# Patient Record
Sex: Female | Born: 1990
Health system: Southern US, Community
[De-identification: ages and names within clinical notes are randomized; demographics above are authoritative.]

## PROBLEM LIST (undated history)

## (undated) DIAGNOSIS — O09299 Supervision of pregnancy with other poor reproductive or obstetric history, unspecified trimester: Secondary | ICD-10-CM

## (undated) DIAGNOSIS — O139 Gestational [pregnancy-induced] hypertension without significant proteinuria, unspecified trimester: Secondary | ICD-10-CM

## (undated) DIAGNOSIS — O09899 Supervision of other high risk pregnancies, unspecified trimester: Secondary | ICD-10-CM

## (undated) HISTORY — DX: Gestational (pregnancy-induced) hypertension without significant proteinuria, unspecified trimester: O13.9

## (undated) HISTORY — PX: TONSILLECTOMY: SUR1361

---

## 2011-05-21 DIAGNOSIS — E669 Obesity, unspecified: Secondary | ICD-10-CM | POA: Insufficient documentation

## 2013-10-16 DIAGNOSIS — B9689 Other specified bacterial agents as the cause of diseases classified elsewhere: Secondary | ICD-10-CM | POA: Insufficient documentation

## 2013-10-16 DIAGNOSIS — N760 Acute vaginitis: Secondary | ICD-10-CM | POA: Insufficient documentation

## 2014-02-12 DIAGNOSIS — A6000 Herpesviral infection of urogenital system, unspecified: Secondary | ICD-10-CM | POA: Insufficient documentation

## 2014-09-13 DIAGNOSIS — F331 Major depressive disorder, recurrent, moderate: Secondary | ICD-10-CM | POA: Insufficient documentation

## 2015-09-09 DIAGNOSIS — O09299 Supervision of pregnancy with other poor reproductive or obstetric history, unspecified trimester: Secondary | ICD-10-CM | POA: Insufficient documentation

## 2015-10-03 DIAGNOSIS — B977 Papillomavirus as the cause of diseases classified elsewhere: Secondary | ICD-10-CM | POA: Insufficient documentation

## 2018-02-26 ENCOUNTER — Other Ambulatory Visit: Payer: Self-pay

## 2018-02-26 ENCOUNTER — Emergency Department (HOSPITAL_BASED_OUTPATIENT_CLINIC_OR_DEPARTMENT_OTHER)
Admission: EM | Admit: 2018-02-26 | Discharge: 2018-02-26 | Disposition: A | Discharge status: Home / Self Care | Payer: Medicaid Other | Attending: Emergency Medicine | Admitting: Emergency Medicine

## 2018-02-26 ENCOUNTER — Encounter (HOSPITAL_BASED_OUTPATIENT_CLINIC_OR_DEPARTMENT_OTHER): Payer: Self-pay | Admitting: Emergency Medicine

## 2018-02-26 DIAGNOSIS — K0889 Other specified disorders of teeth and supporting structures: Secondary | ICD-10-CM | POA: Diagnosis not present

## 2018-02-26 DIAGNOSIS — F17200 Nicotine dependence, unspecified, uncomplicated: Secondary | ICD-10-CM | POA: Diagnosis not present

## 2018-02-26 MED ORDER — LIDOCAINE VISCOUS HCL 2 % MT SOLN: 15.0000 mL | OROMUCOSAL | 0 refills | Status: DC | PRN

## 2018-02-26 MED ORDER — AMOXICILLIN-POT CLAVULANATE 875-125 MG PO TABS: 1.0000 | ORAL_TABLET | Freq: Two times a day (BID) | ORAL | 0 refills | Status: DC

## 2018-02-26 NOTE — ED Triage Notes (Signed)
Dental pain and facial swelling since yesterday.

## 2018-02-26 NOTE — ED Provider Notes (Signed)
[facility] EMERGENCY DEPARTMENT Provider Note   CSN: [id number] Arrival date & time: 02/26/18  1001     History   Chief Complaint Chief Complaint  Patient presents with  . Dental Pain    HPI Kara Sanchez is a 27 y.o. female who presents to ED for several month history of intermittent bilateral dental pain.  States that about 3 to 4 months ago, she had her right upper teeth break off spontaneously.  The same thing happened about 1 month later on her left side.  She states that since yesterday she has had worsening sharp pain and swelling associated with her left upper teeth.  She has not seen a dentist in over a year.  She has not taken any medications to help with her symptoms.  She denies any trismus, drooling, fever, neck pain, changes in voice.  HPI  History reviewed. No pertinent past medical history.  There are no active problems to display for this patient.   Past Surgical History:  Procedure Laterality Date  . TONSILLECTOMY       OB History   No obstetric history on file.      Home Medications    Prior to Admission medications   Medication Sig Start Date End Date Taking? Authorizing Provider  amoxicillin-clavulanate (AUGMENTIN) 875-125 MG tablet Take 1 tablet by mouth every 12 (twelve) hours. 02/26/18   Aydenn Gervin, PA-C  lidocaine (XYLOCAINE) 2 % solution Use as directed 15 mLs in the mouth or throat as needed for mouth pain. 02/26/18   Dietrich PatesKhatri, Braylyn Eye, PA-C    Family History No family history on file.  Social History Social History   Tobacco Use  . Smoking status: Current Every Day Smoker  . Smokeless tobacco: Never Used  Substance Use Topics  . Alcohol use: Not on file  . Drug use: Not on file     Allergies   Patient has no known allergies.   Review of Systems Review of Systems  Constitutional: Negative for chills and fever.  HENT: Positive for dental problem and facial swelling. Negative for rhinorrhea and sinus pressure.     Respiratory: Negative for shortness of breath.   Musculoskeletal: Negative for neck pain.     Physical Exam Updated Vital Signs BP 132/69 (BP Location: Left Arm)   Pulse 92   Temp 97.8 F (36.6 C) (Oral)   Resp 18   Ht 5\' 8"  (1.727 m)   Wt 99.3 kg   LMP 02/02/2018   SpO2 99%   BMI 33.30 kg/m   Physical Exam Vitals signs and nursing note reviewed.  Constitutional:      General: She is not in acute distress.    Appearance: She is well-developed. She is not diaphoretic.  HENT:     Head: Normocephalic and atraumatic.     Mouth/Throat:      Comments: Overall poor dentition with several missing, chipped teeth noted.  Mild induration noted of the left cheek with no fluctuance. No facial, neck swelling noted. No pooling of secretions or trismus.  Normal voice noted with no difficulty swallowing or breathing. No submandibular erythema, edema or crepitus noted. Eyes:     General: No scleral icterus.    Conjunctiva/sclera: Conjunctivae normal.  Neck:     Musculoskeletal: Normal range of motion.  Pulmonary:     Effort: Pulmonary effort is normal. No respiratory distress.  Skin:    Findings: No rash.  Neurological:     Mental Status: She is alert.  ED Treatments / Results  Labs (all labs ordered are listed, but only abnormal results are displayed) Labs Reviewed - No data to display  EKG None  Radiology No results found.  Procedures Procedures (including critical care time)  Medications Ordered in ED Medications - No data to display   Initial Impression / Assessment and Plan / ED Course  I have reviewed the triage vital signs and the nursing notes.  Pertinent labs & imaging results that were available during my care of the patient were reviewed by me and considered in my medical decision making (see chart for details).     Patient with dentalgia. On exam, there is no evidence of a drainable abscess. No trismus, glossal elevation, unilateral tonsillar  swelling. No evidence of retropharyngeal or peritonsillar abscess or Ludwig angina. Will treat with  antibiotic and viscous lidocaine. Pt instructed to follow-up with dentist as soon as possible. Resource guide provided with AVS.  Patient is hemodynamically stable, in NAD, and able to ambulate in the ED. Evaluation does not show pathology that would require ongoing emergent intervention or inpatient treatment. I explained the diagnosis to the patient. Pain has been managed and has no complaints prior to discharge. Patient is comfortable with above plan and is stable for discharge at this time. All questions were answered prior to disposition. Strict return precautions for returning to the ED were discussed. Encouraged follow up with PCP.    Portions of this note were generated with Scientist, clinical (histocompatibility and immunogenetics)Dragon dictation software. Dictation errors may occur despite best attempts at proofreading.   Final Clinical Impressions(s) / ED Diagnoses   Final diagnoses:  Pain, dental    ED Discharge Orders         Ordered    amoxicillin-clavulanate (AUGMENTIN) 875-125 MG tablet  Every 12 hours     02/26/18 1032    lidocaine (XYLOCAINE) 2 % solution  As needed     02/26/18 [address]1032           Klyn Kroening, PA-C 02/26/18 1035    Shaune PollackIsaacs, Cameron, MD 02/26/18 1945

## 2018-02-26 NOTE — ED Notes (Signed)
Right side of face is swollen.  Broken tooth to left upper side of the mouth is noted.  Verbalized pain to left upper gums radiating to her left face.

## 2018-02-26 NOTE — Discharge Instructions (Signed)
Take your antibiotics as prescribed. Take the pain medication as needed. Return to ED for worsening symptoms, trouble breathing or trouble swallowing, increased swelling, trouble opening your mouth.

## 2018-03-09 NOTE — L&D Delivery Note (Addendum)
Delivery Note Progressed quickly to complete dilation with vertex at the perineum  At 12:11 AM a viable and healthy female was delivered via Vaginal, Spontaneous (Presentation:LOA ).  APGAR: , ; weight  .    No difficulty with shoulders  Placenta status: spontaneous and grossly intact with 3 vessel Cord:  with the following complications: nuchal cord x 1, tight, delivered through  Anesthesia:  epidural Episiotomy: None Lacerations: None Suture Repair: none Est. Blood Loss (mL):  150  Mom to postpartum.  Baby to Couplet care / Skin to Skin.  Hansel Feinstein 10/23/2018, 12:24 AM   HP Please schedule this patient for Postpartum visit in: 4 weeks with the following provider: Any provider For C/S patients schedule nurse incision check in weeks 2 weeks: no Low risk pregnancy complicated by: none Delivery mode:  SVD Anticipated Birth Control:  IUD PP Procedures needed: none  Schedule Integrated BH visit: no

## 2018-04-11 ENCOUNTER — Other Ambulatory Visit: Payer: Self-pay

## 2018-04-11 ENCOUNTER — Telehealth (HOSPITAL_BASED_OUTPATIENT_CLINIC_OR_DEPARTMENT_OTHER): Payer: Self-pay | Admitting: *Deleted

## 2018-04-11 ENCOUNTER — Encounter (HOSPITAL_BASED_OUTPATIENT_CLINIC_OR_DEPARTMENT_OTHER): Payer: Self-pay | Admitting: Emergency Medicine

## 2018-04-11 ENCOUNTER — Emergency Department (HOSPITAL_BASED_OUTPATIENT_CLINIC_OR_DEPARTMENT_OTHER): Payer: Medicaid Other

## 2018-04-11 ENCOUNTER — Emergency Department (HOSPITAL_BASED_OUTPATIENT_CLINIC_OR_DEPARTMENT_OTHER)
Admission: EM | Admit: 2018-04-11 | Discharge: 2018-04-11 | Disposition: A | Discharge status: Home / Self Care | Payer: Medicaid Other | Attending: Emergency Medicine | Admitting: Emergency Medicine

## 2018-04-11 DIAGNOSIS — R110 Nausea: Secondary | ICD-10-CM | POA: Diagnosis not present

## 2018-04-11 DIAGNOSIS — O9989 Other specified diseases and conditions complicating pregnancy, childbirth and the puerperium: Secondary | ICD-10-CM | POA: Diagnosis not present

## 2018-04-11 DIAGNOSIS — F17200 Nicotine dependence, unspecified, uncomplicated: Secondary | ICD-10-CM | POA: Insufficient documentation

## 2018-04-11 DIAGNOSIS — Z3A11 11 weeks gestation of pregnancy: Secondary | ICD-10-CM | POA: Insufficient documentation

## 2018-04-11 DIAGNOSIS — R197 Diarrhea, unspecified: Secondary | ICD-10-CM

## 2018-04-11 DIAGNOSIS — R1010 Upper abdominal pain, unspecified: Secondary | ICD-10-CM | POA: Diagnosis not present

## 2018-04-11 DIAGNOSIS — O99331 Smoking (tobacco) complicating pregnancy, first trimester: Secondary | ICD-10-CM | POA: Insufficient documentation

## 2018-04-11 DIAGNOSIS — R143 Flatulence: Secondary | ICD-10-CM | POA: Diagnosis not present

## 2018-04-11 DIAGNOSIS — Z3490 Encounter for supervision of normal pregnancy, unspecified, unspecified trimester: Secondary | ICD-10-CM

## 2018-04-11 HISTORY — DX: Supervision of pregnancy with other poor reproductive or obstetric history, unspecified trimester: O09.299

## 2018-04-11 HISTORY — DX: Supervision of other high risk pregnancies, unspecified trimester: O09.899

## 2018-04-11 LAB — URINALYSIS, MICROSCOPIC (REFLEX)

## 2018-04-11 LAB — CBC WITH DIFFERENTIAL/PLATELET
Abs Immature Granulocytes: 0.03 10*3/uL (ref 0.00–0.07)
Basophils Absolute: 0 10*3/uL (ref 0.0–0.1)
Basophils Relative: 0 %
Eosinophils Absolute: 0.2 10*3/uL (ref 0.0–0.5)
Eosinophils Relative: 3 %
HCT: 41.8 % (ref 36.0–46.0)
Hemoglobin: 12.8 g/dL (ref 12.0–15.0)
Immature Granulocytes: 0 %
Lymphocytes Relative: 16 %
Lymphs Abs: 1.1 10*3/uL (ref 0.7–4.0)
MCH: 22.5 pg — AB (ref 26.0–34.0)
MCHC: 30.6 g/dL (ref 30.0–36.0)
MCV: 73.5 fL — ABNORMAL LOW (ref 80.0–100.0)
Monocytes Absolute: 0.4 10*3/uL (ref 0.1–1.0)
Monocytes Relative: 6 %
Neutro Abs: 5.1 10*3/uL (ref 1.7–7.7)
Neutrophils Relative %: 75 %
Platelets: 304 10*3/uL (ref 150–400)
RBC: 5.69 MIL/uL — ABNORMAL HIGH (ref 3.87–5.11)
RDW: 14.7 % (ref 11.5–15.5)
WBC: 6.8 10*3/uL (ref 4.0–10.5)
nRBC: 0 % (ref 0.0–0.2)

## 2018-04-11 LAB — COMPREHENSIVE METABOLIC PANEL
ALT: 19 U/L (ref 0–44)
AST: 18 U/L (ref 15–41)
Albumin: 4.3 g/dL (ref 3.5–5.0)
Alkaline Phosphatase: 62 U/L (ref 38–126)
Anion gap: 8 (ref 5–15)
BILIRUBIN TOTAL: 0.5 mg/dL (ref 0.3–1.2)
BUN: 7 mg/dL (ref 6–20)
CO2: 22 mmol/L (ref 22–32)
Calcium: 9 mg/dL (ref 8.9–10.3)
Chloride: 106 mmol/L (ref 98–111)
Creatinine, Ser: 0.43 mg/dL — ABNORMAL LOW (ref 0.44–1.00)
GFR calc non Af Amer: >60 mL/min (ref >60)
Glucose, Bld: 86 mg/dL (ref 70–99)
Potassium: 3.6 mmol/L (ref 3.5–5.1)
Sodium: 136 mmol/L (ref 135–145)
TOTAL PROTEIN: 7.7 g/dL (ref 6.5–8.1)

## 2018-04-11 LAB — URINALYSIS, ROUTINE W REFLEX MICROSCOPIC
Bilirubin Urine: NEGATIVE
Glucose, UA: NEGATIVE mg/dL
Hgb urine dipstick: NEGATIVE
Ketones, ur: NEGATIVE mg/dL
NITRITE: NEGATIVE
Protein, ur: NEGATIVE mg/dL
Specific Gravity, Urine: 1.025 (ref 1.005–1.030)
pH: 6 (ref 5.0–8.0)

## 2018-04-11 LAB — C DIFFICILE QUICK SCREEN W PCR REFLEX
C DIFFICLE (CDIFF) ANTIGEN: NEGATIVE
C Diff interpretation: NOT DETECTED
C Diff toxin: NEGATIVE

## 2018-04-11 LAB — GASTROINTESTINAL PANEL BY PCR, STOOL (REPLACES STOOL CULTURE)
ASTROVIRUS: NOT DETECTED
Adenovirus F40/41: NOT DETECTED
Campylobacter species: NOT DETECTED
Cryptosporidium: NOT DETECTED
Cyclospora cayetanensis: NOT DETECTED
Entamoeba histolytica: NOT DETECTED
Enteroaggregative E coli (EAEC): NOT DETECTED
Enteropathogenic E coli (EPEC): NOT DETECTED
Enterotoxigenic E coli (ETEC): NOT DETECTED
Giardia lamblia: NOT DETECTED
Norovirus GI/GII: DETECTED — AB
Plesimonas shigelloides: NOT DETECTED
Rotavirus A: NOT DETECTED
Salmonella species: NOT DETECTED
Sapovirus (I, II, IV, and V): NOT DETECTED
Shiga like toxin producing E coli (STEC): NOT DETECTED
Shigella/Enteroinvasive E coli (EIEC): NOT DETECTED
Vibrio cholerae: NOT DETECTED
Vibrio species: NOT DETECTED
Yersinia enterocolitica: NOT DETECTED

## 2018-04-11 LAB — LIPASE, BLOOD: LIPASE: 24 U/L (ref 11–51)

## 2018-04-11 MED ORDER — SODIUM CHLORIDE 0.9 % IV BOLUS
1000.0000 mL | Freq: Once | INTRAVENOUS | Status: AC
  Administered 2018-04-11: 1000 mL via INTRAVENOUS

## 2018-04-11 MED ORDER — SIMETHICONE 40 MG/0.6ML PO SUSP: 40.0000 mg | Freq: Four times a day (QID) | ORAL | 0 refills | Status: DC | PRN

## 2018-04-11 NOTE — ED Provider Notes (Signed)
[facility] EMERGENCY DEPARTMENT Provider Note   CSN: [id number] Arrival date & time: 04/11/18  [id number]     History   Chief Complaint Chief Complaint  Patient presents with  . Abdominal Pain    HPI Kara Sanchez is a 28 y.o. female.  HPI  [id number] [redacted]wk pregnant 9AM had rumbling abdominal pain, gas, then cramping pain, then began to have diarrhea.   Since 9AM has had about 6-7 episodes of diarrhea, watery diarrhea Constant abdominal cramping, upper part of abdomen on both sides No known sick contacts Nausea but no vomiting December was on abx for abscess No vaginal bleeding or contractions  [facility], in [facility]   Past Medical History:  Diagnosis Date  . Hx of preeclampsia, prior pregnancy, currently pregnant   . Previous pregnancy complicated by pregnancy-induced hypertension, antepartum     There are no active problems to display for this patient.   Past Surgical History:  Procedure Laterality Date  . TONSILLECTOMY       OB History    Gravida  7   Para  4   Term      Preterm      AB  2   Living        SAB  2   TAB      Ectopic      Multiple      Live Births               Home Medications    Prior to Admission medications   Medication Sig Start Date End Date Taking? Authorizing Provider  amoxicillin-clavulanate (AUGMENTIN) 875-125 MG tablet Take 1 tablet by mouth every 12 (twelve) hours. 02/26/18   Khatri, Hina, PA-C  lidocaine (XYLOCAINE) 2 % solution Use as directed 15 mLs in the mouth or throat as needed for mouth pain. 02/26/18   Dietrich PatesKhatri, Hina, PA-C    Family History No family history on file.  Social History Social History   Tobacco Use  . Smoking status: Current Every Day Smoker  . Smokeless tobacco: Never Used  Substance Use Topics  . Alcohol use: Not on file  . Drug use: Not on file     Allergies   Patient has no known allergies.   Review of Systems Review of Systems  Constitutional: Negative for  fever.  HENT: Negative for sore throat.   Eyes: Negative for visual disturbance.  Respiratory: Negative for cough and shortness of breath.   Cardiovascular: Negative for chest pain.  Gastrointestinal: Positive for abdominal pain, diarrhea and nausea. Negative for constipation and vomiting.  Genitourinary: Negative for difficulty urinating and dysuria.  Musculoskeletal: Negative for back pain.  Skin: Negative for rash.  Neurological: Positive for light-headedness. Negative for syncope and headaches.     Physical Exam Updated Vital Signs BP 139/64   Pulse 92   Temp 97.9 F (36.6 C) (Oral)   Resp 20   Ht 5\' 8"  (1.727 m)   Wt 99.3 kg   LMP 12/30/2017 (Exact Date) Comment: Patient claims she was keeping track  SpO2 100%   BMI 33.30 kg/m   Physical Exam Vitals signs and nursing note reviewed.  Constitutional:      General: She is not in acute distress.    Appearance: She is well-developed. She is not diaphoretic.  HENT:     Head: Normocephalic and atraumatic.  Eyes:     Conjunctiva/sclera: Conjunctivae normal.  Neck:     Musculoskeletal: Normal range of motion.  Cardiovascular:  Rate and Rhythm: Normal rate and regular rhythm.     Heart sounds: Normal heart sounds. No murmur. No friction rub. No gallop.   Pulmonary:     Effort: Pulmonary effort is normal. No respiratory distress.     Breath sounds: Normal breath sounds. No wheezing or rales.  Abdominal:     General: There is no distension.     Palpations: Abdomen is soft.     Tenderness: There is no abdominal tenderness. There is no guarding.  Musculoskeletal:        General: No tenderness.  Skin:    General: Skin is warm and dry.     Findings: No erythema or rash.  Neurological:     Mental Status: She is alert and oriented to person, place, and time.      ED Treatments / Results  Labs (all labs ordered are listed, but only abnormal results are displayed) Labs Reviewed  URINALYSIS, ROUTINE W REFLEX  MICROSCOPIC - Abnormal; Notable for the following components:      Result Value   Leukocytes, UA TRACE (*)    All other components within normal limits  CBC WITH DIFFERENTIAL/PLATELET - Abnormal; Notable for the following components:   RBC 5.69 (*)    MCV 73.5 (*)    MCH 22.5 (*)    All other components within normal limits  COMPREHENSIVE METABOLIC PANEL - Abnormal; Notable for the following components:   Creatinine, Ser 0.43 (*)    All other components within normal limits  URINALYSIS, MICROSCOPIC (REFLEX) - Abnormal; Notable for the following components:   Bacteria, UA MANY (*)    All other components within normal limits  URINE CULTURE  C DIFFICILE QUICK SCREEN W PCR REFLEX  GASTROINTESTINAL PANEL BY PCR, STOOL (REPLACES STOOL CULTURE)  LIPASE, BLOOD    EKG None  Radiology US Ob Comp Less 14 Wks  Result Date: 04/11/2018 CLINICAL DATA:  Cramps and diarrhea. EXAM: OBSTETRIC <14 WK ULTRASOUND TECHNIQUE: Transabdominal ultrasound was performed for evaluation of the gestation as well as the maternal uterus and adnexal regions. COMPARISON:  None. FINDINGS: Intrauterine gestational sac: Single Yolk sac:  Not visualized Embryo:  Present Cardiac Activity: Present Heart Rate: 169 bpm CRL:   45 mm   11 w 2 d                  [place] EDC: 10/29/2018 Subchorionic hemorrhage:  None visualized. Maternal uterus/adnexae: Mild posterior myometrial thickening likely reflecting a focal contraction. No mass or pelvic fluid is seen. IMPRESSION: 1. Single living intrauterine pregnancy measuring 11 weeks 2 days. 2. No acute finding. Electronically Signed   By: Marnee Spring M.D.   On: 04/11/2018 09:17    Procedures Procedures (including critical care time)  Medications Ordered in ED Medications  sodium chloride 0.9 % bolus 1,000 mL (1,000 mLs Intravenous New Bag/Given 04/11/18 0805)     Initial Impression / Assessment and Plan / ED Course  I have reviewed the triage vital signs and the nursing  notes.  Pertinent labs & imaging results that were available during my care of the patient were reviewed by me and considered in my medical decision making (see chart for details).     27yo female G5P4004 at 14 weeks by LMP (01/02/2018) presents with concern for abdominal pain and diarrhea.  Abdominal exam benign, doubt acute appendicitis, cholecystitis. Suspect contamination of UA, doubt UTI or asymptomatic bacteruria and will send for culture. No WBC in urine. History is most consistent with gastroenteritis or  colitis.   Perform US given abdominal pain without US to confirm dates and IUP.   Labs obtained show no significant abnormalities.  Given IV fluids for rehydration. Given pt pregnant, recent abx, ordered GI pathogen panel and CDiff studies.    These are pending at time of discharge. If they are positive for bacterial infection will treat given high risk state of pregnancy.  Discussed may use simethicone, bland diet, stay hydrated. Patient discharged in stable condition with understanding of reasons to return.   Final Clinical Impressions(s) / ED Diagnoses   Final diagnoses:  Diarrhea of presumed infectious origin    ED Discharge Orders    None       Alvira MondaySchlossman, Larrissa Stivers, MD 04/11/18 1014

## 2018-04-11 NOTE — ED Notes (Signed)
Patient transported to Ultrasound 

## 2018-04-11 NOTE — ED Notes (Signed)
C/o abd cramping onset 2100 last pm w diarrhea and nausea  Denies vag dc

## 2018-04-11 NOTE — ED Triage Notes (Signed)
Pt states that she is 4 months pregnant and started experiencing sharp crampy abdominal pain starting approx 2100 last night. States she feels nauseous but has not vomited. Has been experiencing diarrhea. Denies vaginal discharge or bleeding

## 2018-04-12 DIAGNOSIS — Z3490 Encounter for supervision of normal pregnancy, unspecified, unspecified trimester: Secondary | ICD-10-CM | POA: Insufficient documentation

## 2018-04-12 LAB — URINE CULTURE: Culture: >=100000 — AB

## 2018-04-13 NOTE — Telephone Encounter (Signed)
Post ED Visit - Positive Culture Follow-up  Culture report reviewed by antimicrobial stewardship pharmacist:  []  Enzo Bi, Pharm.D. []  Celedonio Miyamoto, [address].D., BCPS AQ-ID []  Garvin Fila, Pharm.D., BCPS []  Georgina Pillion, Pharm.D., BCPS []  [place], [address].D., BCPS, AAHIVP []  Estella Husk, Pharm.D., BCPS, AAHIVP []  Lysle Pearl, PharmD, BCPS []  Phillips Climes, PharmD, BCPS []  Agapito Games, PharmD, BCPS []  Verlan Friends, PharmD Babs Bertin PharmD  Positive urine culture Treated with none, OB GYN will further management, organism sensitive to the same and no further patient follow-up is required at this time.  Berle Mull 04/13/2018, 1:37 PM

## 2018-04-13 NOTE — ED Provider Notes (Signed)
Patient's urine culture report was brought to my attention by the ED pharmacist and my review was requested.  Patient was reportedly seen in the ED April 11, 2018 for abdominal cramping and diarrhea.  She is pregnant.  She reportedly denied urinary symptoms at the time of the visit.  Abnormal Labs Reviewed  URINE CULTURE - Abnormal; Notable for the following components:      Result Value   Culture   (*)    Value: >=100,000 COLONIES/mL CORYNEBACTERIUM SPECIES Standardized susceptibility testing for this organism is not available. Performed at [facility], [address] [place], [place] [address]    All other components within normal limits  GASTROINTESTINAL PANEL BY PCR, STOOL (REPLACES STOOL CULTURE) - Abnormal; Notable for the following components:   Norovirus GI/GII DETECTED (*)    All other components within normal limits  URINALYSIS, ROUTINE W REFLEX MICROSCOPIC - Abnormal; Notable for the following components:   Leukocytes, UA TRACE (*)    All other components within normal limits  CBC WITH DIFFERENTIAL/PLATELET - Abnormal; Notable for the following components:   RBC 5.69 (*)    MCV 73.5 (*)    MCH 22.5 (*)    All other components within normal limits  COMPREHENSIVE METABOLIC PANEL - Abnormal; Notable for the following components:   Creatinine, Ser 0.43 (*)    All other components within normal limits  URINALYSIS, MICROSCOPIC (REFLEX) - Abnormal; Notable for the following components:   Bacteria, UA MANY (*)    All other components within normal limits     11:34 AM Called patient's OBGYN office, [facility] [phone]).  I spoke with Dr. Sharene SkeansMichael O'Keiffe for his insight.  He states another urine culture sample was obtained by their office yesterday and results are pending.  I do not need to do anything further with this patient or her results.  He will follow-up on their office culture and decide about treatment at that time.     Anselm PancoastJoy, Shawn C,  PA-C 04/13/18 1628    Alvira MondaySchlossman, Erin, MD 04/15/18 2220

## 2018-04-13 NOTE — Progress Notes (Signed)
ED Antimicrobial Stewardship Positive Culture Follow Up   [facility] Kara Sanchez is an 28 y.o. female who presented to [facility] on 04/11/2018 with a chief complaint of  Chief Complaint  Patient presents with  . Abdominal Pain    Recent Results (from the past 720 hour(s))  Urine culture     Status: Abnormal   Collection Time: 04/11/18  7:29 AM  Result Value Ref Range Status   Specimen Description   Final    URINE, CLEAN CATCH Performed at [facility], [address] [place], [place] [address]    Special Requests   Final    NONE Performed at [facility], [address] [place], [place] [address]    Culture (A)  Final    >=100,000 COLONIES/mL CORYNEBACTERIUM SPECIES Standardized susceptibility testing for this organism is not available. Performed at [facility], [address], [place], [place] [address]    Report Status 04/12/2018 FINAL  Final  C difficile quick scan w PCR reflex     Status: None   Collection Time: 04/11/18  7:42 AM  Result Value Ref Range Status   C Diff antigen NEGATIVE NEGATIVE Final   C Diff toxin NEGATIVE NEGATIVE Final   C Diff interpretation No C. difficile detected.  Final    Comment: Performed at [facility], [address] [place], [place] [address]  Gastrointestinal Panel by PCR , Stool     Status: Abnormal   Collection Time: 04/11/18  7:42 AM  Result Value Ref Range Status   Campylobacter species NOT DETECTED NOT DETECTED Final   Plesimonas shigelloides NOT DETECTED NOT DETECTED Final   Salmonella species NOT DETECTED NOT DETECTED Final   Yersinia enterocolitica NOT DETECTED NOT DETECTED Final   Vibrio species NOT DETECTED NOT DETECTED Final   Vibrio cholerae NOT DETECTED NOT DETECTED Final   Enteroaggregative E coli (EAEC) NOT DETECTED NOT DETECTED Final   Enteropathogenic E coli (EPEC) NOT DETECTED NOT DETECTED Final   Enterotoxigenic E coli (ETEC) NOT DETECTED NOT DETECTED Final   Shiga like toxin producing E  coli (STEC) NOT DETECTED NOT DETECTED Final   Shigella/Enteroinvasive E coli (EIEC) NOT DETECTED NOT DETECTED Final   Cryptosporidium NOT DETECTED NOT DETECTED Final   Cyclospora cayetanensis NOT DETECTED NOT DETECTED Final   Entamoeba histolytica NOT DETECTED NOT DETECTED Final   Giardia lamblia NOT DETECTED NOT DETECTED Final   Adenovirus F40/41 NOT DETECTED NOT DETECTED Final   Astrovirus NOT DETECTED NOT DETECTED Final   Norovirus GI/GII DETECTED (A) NOT DETECTED Final    Comment: RESULT CALLED TO, READ BACK BY AND VERIFIED WITH: AMY HARTLEY @1435  ON 04/11/2018 BY FMW    Rotavirus A NOT DETECTED NOT DETECTED Final   Sapovirus (I, II, IV, and V) NOT DETECTED NOT DETECTED Final    Comment: Performed at [facility], [address] [place], [place] [address]    [x]  Patient discharged originally without antimicrobial agent  New antibiotic prescription: No treatment indicated at this time - ED PA discussed with patient's OB/GYN Dr. Silvestre Gunner, who will handle further treatment and repeat culture.  ED Provider: Harolyn Rutherford, PA-C   Almon Hercules 04/13/2018, 12:29 PM Clinical Pharmacist Monday - Friday phone -  [phone] Saturday - Sunday phone - [phone]

## 2018-04-14 DIAGNOSIS — Z2839 Other underimmunization status: Secondary | ICD-10-CM | POA: Insufficient documentation

## 2018-04-14 DIAGNOSIS — Z283 Underimmunization status: Secondary | ICD-10-CM | POA: Insufficient documentation

## 2018-04-27 DIAGNOSIS — O99320 Drug use complicating pregnancy, unspecified trimester: Secondary | ICD-10-CM | POA: Insufficient documentation

## 2018-08-30 ENCOUNTER — Ambulatory Visit (INDEPENDENT_AMBULATORY_CARE_PROVIDER_SITE_OTHER): Payer: Medicaid Other | Admitting: Advanced Practice Midwife

## 2018-08-30 ENCOUNTER — Other Ambulatory Visit: Payer: Self-pay

## 2018-08-30 ENCOUNTER — Encounter: Payer: Self-pay | Admitting: Advanced Practice Midwife

## 2018-08-30 DIAGNOSIS — Z3A29 29 weeks gestation of pregnancy: Secondary | ICD-10-CM

## 2018-08-30 DIAGNOSIS — O09893 Supervision of other high risk pregnancies, third trimester: Secondary | ICD-10-CM | POA: Diagnosis not present

## 2018-08-30 DIAGNOSIS — O09899 Supervision of other high risk pregnancies, unspecified trimester: Secondary | ICD-10-CM

## 2018-08-30 MED ORDER — AMBULATORY NON FORMULARY MEDICATION: 1.0000 | 0 refills | Status: DC

## 2018-08-30 NOTE — Progress Notes (Signed)
  Subjective:    Kara Sanchez is being seen today for her first obstetrical visit.  This is not a planned pregnancy. She is at [redacted]w[redacted]d gestation. Her obstetrical history is significant for obesity, pre-eclampsia and grand multigravida. Relationship with FOB: spouse, living together. Patient does intend to breast feed. Pregnancy history fully reviewed.  Patient reports no complaints.  Review of Systems:   Review of Systems  Objective:     Wt 112 kg   LMP 02/02/2018 (Exact Date)   BMI 37.56 kg/m  Physical Exam  Exam    Assessment:    Pregnancy: [id number] There are no active problems to display for this patient.      Plan:     Initial labs drawn. Prenatal vitamins. Problem list reviewed and updated. AFP3 discussed: results reviewed. Role of ultrasound in pregnancy discussed; fetal survey: results reviewed. Amniocentesis discussed: not indicated. Follow up in 2 weeks. 50% of 30 min visit spent on counseling and coordination of care.   Welcomed to practice Routines reviewed including learners, [facility] location, visitor policies   Hansel Feinstein 08/30/2018

## 2018-08-30 NOTE — Patient Instructions (Signed)

## 2018-09-13 ENCOUNTER — Telehealth: Payer: Self-pay

## 2018-09-13 NOTE — Telephone Encounter (Signed)
Pt sent Mychart message stating that her hands are swollen and her pulse is elevated. Advised pt to check BP and to drink more water. Pt states will call with BP reading when she gets home today.  Kara Sanchez l Kara Sanchez, CMA

## 2018-09-14 ENCOUNTER — Other Ambulatory Visit: Payer: Self-pay

## 2018-09-14 ENCOUNTER — Encounter (HOSPITAL_COMMUNITY): Payer: Self-pay | Admitting: *Deleted

## 2018-09-14 ENCOUNTER — Inpatient Hospital Stay (HOSPITAL_COMMUNITY)
Admission: AD | Admit: 2018-09-14 | Discharge: 2018-09-14 | Disposition: A | Discharge status: Home / Self Care | Payer: Medicaid Other | Attending: Obstetrics and Gynecology | Admitting: Obstetrics and Gynecology

## 2018-09-14 DIAGNOSIS — O99353 Diseases of the nervous system complicating pregnancy, third trimester: Secondary | ICD-10-CM | POA: Diagnosis not present

## 2018-09-14 DIAGNOSIS — H539 Unspecified visual disturbance: Secondary | ICD-10-CM | POA: Insufficient documentation

## 2018-09-14 DIAGNOSIS — O26893 Other specified pregnancy related conditions, third trimester: Secondary | ICD-10-CM | POA: Insufficient documentation

## 2018-09-14 DIAGNOSIS — G5600 Carpal tunnel syndrome, unspecified upper limb: Secondary | ICD-10-CM | POA: Diagnosis not present

## 2018-09-14 DIAGNOSIS — O09299 Supervision of pregnancy with other poor reproductive or obstetric history, unspecified trimester: Secondary | ICD-10-CM

## 2018-09-14 DIAGNOSIS — R030 Elevated blood-pressure reading, without diagnosis of hypertension: Secondary | ICD-10-CM | POA: Insufficient documentation

## 2018-09-14 DIAGNOSIS — Z3689 Encounter for other specified antenatal screening: Secondary | ICD-10-CM

## 2018-09-14 DIAGNOSIS — Z3A32 32 weeks gestation of pregnancy: Secondary | ICD-10-CM | POA: Diagnosis not present

## 2018-09-14 DIAGNOSIS — O26899 Other specified pregnancy related conditions, unspecified trimester: Secondary | ICD-10-CM

## 2018-09-14 LAB — CBC
HCT: 33.5 % — ABNORMAL LOW (ref 36.0–46.0)
Hemoglobin: 11 g/dL — ABNORMAL LOW (ref 12.0–15.0)
MCH: 24 pg — ABNORMAL LOW (ref 26.0–34.0)
MCHC: 32.8 g/dL (ref 30.0–36.0)
MCV: 73 fL — ABNORMAL LOW (ref 80.0–100.0)
Platelets: 276 10*3/uL (ref 150–400)
RBC: 4.59 MIL/uL (ref 3.87–5.11)
RDW: 14.1 % (ref 11.5–15.5)
WBC: 8.5 10*3/uL (ref 4.0–10.5)
nRBC: 0 % (ref 0.0–0.2)

## 2018-09-14 LAB — COMPREHENSIVE METABOLIC PANEL
ALT: 13 U/L (ref 0–44)
AST: 14 U/L — ABNORMAL LOW (ref 15–41)
Albumin: 2.9 g/dL — ABNORMAL LOW (ref 3.5–5.0)
Alkaline Phosphatase: 90 U/L (ref 38–126)
Anion gap: 10 (ref 5–15)
BUN: <5 mg/dL — ABNORMAL LOW (ref 6–20)
CO2: 19 mmol/L — ABNORMAL LOW (ref 22–32)
Calcium: 8.4 mg/dL — ABNORMAL LOW (ref 8.9–10.3)
Chloride: 109 mmol/L (ref 98–111)
Creatinine, Ser: 0.38 mg/dL — ABNORMAL LOW (ref 0.44–1.00)
GFR calc Af Amer: >60 mL/min (ref >60)
GFR calc non Af Amer: >60 mL/min (ref >60)
Glucose, Bld: 91 mg/dL (ref 70–99)
Potassium: 3.3 mmol/L — ABNORMAL LOW (ref 3.5–5.1)
Sodium: 138 mmol/L (ref 135–145)
Total Bilirubin: 0.2 mg/dL — ABNORMAL LOW (ref 0.3–1.2)
Total Protein: 5.9 g/dL — ABNORMAL LOW (ref 6.5–8.1)

## 2018-09-14 LAB — URINALYSIS, ROUTINE W REFLEX MICROSCOPIC
Bilirubin Urine: NEGATIVE
Glucose, UA: NEGATIVE mg/dL
Hgb urine dipstick: NEGATIVE
Ketones, ur: NEGATIVE mg/dL
Nitrite: NEGATIVE
Protein, ur: NEGATIVE mg/dL
Specific Gravity, Urine: 1.017 (ref 1.005–1.030)
pH: 7 (ref 5.0–8.0)

## 2018-09-14 LAB — PROTEIN / CREATININE RATIO, URINE
Creatinine, Urine: 156.52 mg/dL
Protein Creatinine Ratio: 0.12 mg/mg{Cre} (ref 0.00–0.15)
Total Protein, Urine: 18 mg/dL

## 2018-09-14 NOTE — Telephone Encounter (Signed)
Pt called the office back stating that she took her BP twice. Pt states that both BP readings were 144/85 and over the weekend her BP was 152/85. Pt reports dizziness with numbness and swelling in both hands. Pt advised to go to [facility]. Understanding was voiced. Cornelia Walraven l Breckyn Ticas, CMA

## 2018-09-14 NOTE — MAU Provider Note (Signed)
History     CSN: [id number]  Arrival date and time: 09/14/18 1222   First Provider Initiated Contact with Patient 09/14/18 1317      Chief Complaint  Patient presents with  . Hypertension   Kara Sanchez is a 28 y.o. [id number] at 17w0dwho presents to MAU for preeclampsia evaluation after pt took BP at home this morning and it was 140s/80s. Pt called the clinic and was told to come to MAU for evaluation. Pt reports she "felt weird" over the 4th of July weekend (pt could not clarify further) and yesterday at work her hands went numb for a short period of time. Pt has not had BP cuff evaluated for fit at clinic.  Pt reports seeing spots x1wk. Pt reports the spots come and go and happen mostly at work while she is standing for long periods of time. Pt denies visual changes in MAU.  Pt denies HA, blurry vision, N/V, epigastric pain, swelling in face and hands, sudden weight gain. Pt denies chest pain and SOB.  Pt denies constipation, diarrhea, or urinary problems. Pt denies fever, chills, fatigue, sweating or changes in appetite. Pt denies dizziness, light-headedness, weakness.  Pt denies VB, ctx, LOF and reports good FM.  Current pregnancy problems? hx of preeclampsia Allergies? NKDA Current medications? Valtrex, PNV, Tylenol PRN for HA (pt reports this works for her) [facility]   OB History    Gravida  7   Para  4   Term  4   Preterm      AB  2   Living  4     SAB  2   TAB      Ectopic      Multiple      Live Births  4           Past Medical History:  Diagnosis Date  . Hx of preeclampsia, prior pregnancy, currently pregnant   . Previous pregnancy complicated by pregnancy-induced hypertension, antepartum     Past Surgical History:  Procedure Laterality Date  . TONSILLECTOMY      Family History  Problem Relation Age of Onset  . Multiple sclerosis Father     Social History   Tobacco Use  . Smoking status: Never  Smoker  . Smokeless tobacco: Never Used  Substance Use Topics  . Alcohol use: Never    Frequency: Never  . Drug use: Never    Allergies: No Known Allergies  Medications Prior to Admission  Medication Sig Dispense Refill Last Dose  . Prenatal Vit-Fe Fumarate-FA (PRENATAL VITAMINS PO) Take by mouth.   09/14/2018 at 0900  . valACYclovir (VALTREX) 500 MG tablet Take 500 mg by mouth 2 (two) times daily.   09/14/2018 at 0900  . AMBULATORY NON FORMULARY MEDICATION 1 Device by Other route once a week. Blood pressure cuff/ Medium  Monitored regularly at home/ ICD 10: HROB O09.09 1 kit 0   . amoxicillin-clavulanate (AUGMENTIN) 875-125 MG tablet Take 1 tablet by mouth every 12 (twelve) hours. (Patient not taking: Reported on 08/30/2018) 14 tablet 0   . lidocaine (XYLOCAINE) 2 % solution Use as directed 15 mLs in the mouth or throat as needed for mouth pain. (Patient not taking: Reported on 08/30/2018) 100 mL 0   . Prenatal MV-Min-Fe Fum-FA-DHA (PRENATAL 1) 30-0.975-200 MG CAPS Take by mouth.     . simethicone (MYLICON) 40 MHY/8.5OYdrops Take 0.6 mLs (40 mg total) by mouth 4 (four) times daily as needed  for flatulence (abdominal discomfort). (Patient not taking: Reported on 08/30/2018) 30 mL 0   . valACYclovir (VALTREX) 500 MG tablet Take by mouth.       Review of Systems  Constitutional: Negative for chills, diaphoresis, fatigue and fever.  Eyes: Positive for visual disturbance.  Respiratory: Negative for shortness of breath.   Cardiovascular: Negative for chest pain.  Gastrointestinal: Negative for abdominal pain, constipation, diarrhea, nausea and vomiting.  Genitourinary: Negative for dysuria, flank pain, frequency, pelvic pain, urgency, vaginal bleeding and vaginal discharge.  Neurological: Positive for numbness. Negative for dizziness, weakness, light-headedness and headaches.   Physical Exam   Blood pressure (!) 118/51, pulse (!) 101, temperature 98.5 F (36.9 C), resp. rate 18, height 5' 8"   (1.727 m), weight 114.8 kg, last menstrual period 02/02/2018.  Patient Vitals for the past 24 hrs:  BP Temp Pulse Resp Height Weight  09/14/18 1502 (!) 118/51 - (!) 101 - - -  09/14/18 1431 (!) 118/48 - (!) 101 - - -  09/14/18 1415 (!) 101/52 - (!) 103 - - -  09/14/18 1400 (!) 120/55 - (!) 108 - - -  09/14/18 1345 106/60 - (!) 106 - - -  09/14/18 1330 115/71 - (!) 109 - - -  09/14/18 1315 (!) 126/52 - (!) 110 - - -  09/14/18 1310 127/60 - (!) 108 - - -  09/14/18 1244 135/64 98.5 F (36.9 C) (!) 110 18 5' 8"  (1.727 m) 114.8 kg   Physical Exam  Constitutional: She is oriented to person, place, and time. She appears well-developed and well-nourished. No distress.  HENT:  Head: Normocephalic and atraumatic.  Respiratory: Effort normal.  GI: Soft. She exhibits no distension and no mass. There is no abdominal tenderness. There is no rebound and no guarding.  Neurological: She is alert and oriented to person, place, and time.  Skin: Skin is warm and dry. She is not diaphoretic.  Psychiatric: She has a normal mood and affect. Her behavior is normal. Judgment and thought content normal.   Results for orders placed or performed during the hospital encounter of 09/14/18 (from the past 24 hour(s))  Protein / creatinine ratio, urine     Status: None   Collection Time: 09/14/18  1:16 PM  Result Value Ref Range   Creatinine, Urine 156.52 mg/dL   Total Protein, Urine 18 mg/dL   Protein Creatinine Ratio 0.12 0.00 - 0.15 mg/mg[Cre]  Urinalysis, Routine w reflex microscopic     Status: Abnormal   Collection Time: 09/14/18  1:16 PM  Result Value Ref Range   Color, Urine YELLOW YELLOW   APPearance HAZY (A) CLEAR   Specific Gravity, Urine 1.017 1.005 - 1.030   pH 7.0 5.0 - 8.0   Glucose, UA NEGATIVE NEGATIVE mg/dL   Hgb urine dipstick NEGATIVE NEGATIVE   Bilirubin Urine NEGATIVE NEGATIVE   Ketones, ur NEGATIVE NEGATIVE mg/dL   Protein, ur NEGATIVE NEGATIVE mg/dL   Nitrite NEGATIVE NEGATIVE    Leukocytes,Ua TRACE (A) NEGATIVE   RBC / HPF 0-5 0 - 5 RBC/hpf   WBC, UA 0-5 0 - 5 WBC/hpf   Bacteria, UA RARE (A) NONE SEEN   Squamous Epithelial / LPF 0-5 0 - 5   Mucus PRESENT   CBC     Status: Abnormal   Collection Time: 09/14/18  1:29 PM  Result Value Ref Range   WBC 8.5 4.0 - 10.5 K/uL   RBC 4.59 3.87 - 5.11 MIL/uL   Hemoglobin 11.0 (L) 12.0 - 15.0  g/dL   HCT 33.5 (L) 36.0 - 46.0 %   MCV 73.0 (L) 80.0 - 100.0 fL   MCH 24.0 (L) 26.0 - 34.0 pg   MCHC 32.8 30.0 - 36.0 g/dL   RDW 14.1 11.5 - 15.5 %   Platelets 276 150 - 400 K/uL   nRBC 0.0 0.0 - 0.2 %  Comprehensive metabolic panel     Status: Abnormal   Collection Time: 09/14/18  1:29 PM  Result Value Ref Range   Sodium 138 135 - 145 mmol/L   Potassium 3.3 (L) 3.5 - 5.1 mmol/L   Chloride 109 98 - 111 mmol/L   CO2 19 (L) 22 - 32 mmol/L   Glucose, Bld 91 70 - 99 mg/dL   BUN <5 (L) 6 - 20 mg/dL   Creatinine, Ser 0.38 (L) 0.44 - 1.00 mg/dL   Calcium 8.4 (L) 8.9 - 10.3 mg/dL   Total Protein 5.9 (L) 6.5 - 8.1 g/dL   Albumin 2.9 (L) 3.5 - 5.0 g/dL   AST 14 (L) 15 - 41 U/L   ALT 13 0 - 44 U/L   Alkaline Phosphatase 90 38 - 126 U/L   Total Bilirubin 0.2 (L) 0.3 - 1.2 mg/dL   GFR calc non Af Amer >60 >60 mL/min   GFR calc Af Amer >60 >60 mL/min   Anion gap 10 5 - 15   No results found.  MAU Course  Procedures  MDM -preeclampsia work-up without symptoms in MAU -normal pressures in MAU -UA: hazy/trace leuks/rare bacteria, sending urine for culture -CBC: WNL for pregnancy, platelets 276 (were 304 04/11/2018) -CMP: no abnormalities requiring treatment, AST/ALT 14/13 -PCr: 0.12 -EFM: reactive with single variable       -baseline: 140/130       -variability: moderate       -accels: present, 15x15       -decels: single variable       -TOCO: irritability -advised wrist splints for carpal tunnel -pt discharged to home in stable condition  Orders Placed This Encounter  Procedures  . Culture, OB Urine    Standing Status:    Standing    Number of Occurrences:   1  . CBC    Standing Status:   Standing    Number of Occurrences:   1  . Comprehensive metabolic panel    Standing Status:   Standing    Number of Occurrences:   1  . Protein / creatinine ratio, urine    Standing Status:   Standing    Number of Occurrences:   1  . Urinalysis, Routine w reflex microscopic    Standing Status:   Standing    Number of Occurrences:   1  . Discharge patient    Order Specific Question:   Discharge disposition    Answer:   01-Home or Self Care [1]    Order Specific Question:   Discharge patient date    Answer:   09/14/2018   No orders of the defined types were placed in this encounter.  Assessment and Plan   1. Elevated blood-pressure reading without diagnosis of hypertension   2. Carpal tunnel syndrome during pregnancy   3. NST (non-stress test) reactive   4. [redacted] weeks gestation of pregnancy   5. Hx of preeclampsia, prior pregnancy, currently pregnant   6. Visual disturbance    Allergies as of 09/14/2018   No Known Allergies     Medication List    TAKE these medications   AMBULATORY NON  FORMULARY MEDICATION 1 Device by Other route once a week. Blood pressure cuff/ Medium  Monitored regularly at home/ ICD 10: HROB O09.09   amoxicillin-clavulanate 875-125 MG tablet Commonly known as: AUGMENTIN Take 1 tablet by mouth every 12 (twelve) hours.   lidocaine 2 % solution Commonly known as: XYLOCAINE Use as directed 15 mLs in the mouth or throat as needed for mouth pain.   Prenatal 1 30-0.975-200 MG Caps Take by mouth.   PRENATAL VITAMINS PO Take by mouth.   simethicone 40 MG/0.6ML drops Commonly known as: MYLICON Take 0.6 mLs (40 mg total) by mouth 4 (four) times daily as needed for flatulence (abdominal discomfort).   valACYclovir 500 MG tablet Commonly known as: VALTREX Take 500 mg by mouth 2 (two) times daily. What changed: Another medication with the same name was removed. Continue taking this  medication, and follow the directions you see here.      -BP cuff assessed for fit today by RN -pt advised to purchase wrist splints OTC for carpal tunnel, pt advised to return for care if worsening neurological s/sx -advised compression socks/stockings for use when standing for long periods at work, discussed proper fit and use -strict preeclampsia/PTL/return MAU precautions -pt to keep appt for tomorrow -pt discharged to home in stable condition  Kara Sanchez 09/14/2018, 3:09 PM

## 2018-09-14 NOTE — MAU Note (Signed)
Pt reports she has elevated b/p today. 144/85 at home. Has been having swelling in her hands tha made them feel numb. Has had headache  None right now. Has periods of dizziness and seeing spots. Also c/o pelvic pressure that is making difficult to walk.

## 2018-09-14 NOTE — Discharge Instructions (Signed)
Preterm Labor and Birth Information  The normal length of a pregnancy is 39-41 weeks. Preterm labor is when labor starts before 37 completed weeks of pregnancy. What are the risk factors for preterm labor? Preterm labor is more likely to occur in women who:  Have certain infections during pregnancy such as a bladder infection, sexually transmitted infection,   Carpal Tunnel Syndrome  Carpal tunnel syndrome is a condition that causes pain in your hand and arm. The carpal tunnel is a narrow area located on the palm side of your wrist. Repeated wrist motion or certain diseases may cause swelling within the tunnel. This swelling pinches the main nerve in the wrist (median nerve). What are the causes? This condition may be caused by: Repeated wrist motions. Wrist injuries. Arthritis. A cyst or tumor in the carpal tunnel. Fluid buildup during pregnancy. Sometimes the cause of this condition is not known. What increases the risk? The following factors may make you more likely to develop this condition: Having a job, such as being a Haematologistbutcher or a cashier, that requires you to repeatedly move your wrist in the same motion. Being a woman. Having certain conditions, such as: Diabetes. Obesity. An underactive thyroid (hypothyroidism). Kidney failure. What are the signs or symptoms? Symptoms of this condition include: A tingling feeling in your fingers, especially in your thumb, index, and middle fingers. Tingling or numbness in your hand. An aching feeling in your entire arm, especially when your wrist and elbow are bent for a long time. Wrist pain that goes up your arm to your shoulder. Pain that goes down into your palm or fingers. A weak feeling in your hands. You may have trouble grabbing and holding items. Your symptoms may feel worse during the night. How is this diagnosed? This condition is diagnosed with a medical history and physical exam. You may also have tests,  including: Electromyogram (EMG). This test measures electrical signals sent by your nerves into the muscles. Nerve conduction study. This test measures how well electrical signals pass through your nerves. Imaging tests, such as X-rays, ultrasound, and MRI. These tests check for possible causes of your condition. How is this treated? This condition may be treated with: Lifestyle changes. It is important to stop or change the activity that caused your condition. Doing exercise and activities to strengthen your muscles and bones (physical therapy). Learning how to use your hand again after diagnosis (occupational therapy). Medicines for pain and inflammation. This may include medicine that is injected into your wrist. A wrist splint. Surgery. Follow these instructions at home: If you have a splint: Wear the splint as told by your health care provider. Remove it only as told by your health care provider. Loosen the splint if your fingers tingle, become numb, or turn cold and blue. Keep the splint clean. If the splint is not waterproof: Do not let it get wet. Cover it with a watertight covering when you take a bath or shower. Managing pain, stiffness, and swelling  If directed, put ice on the painful area: If you have a removable splint, remove it as told by your health care provider. Put ice in a plastic bag. Place a towel between your skin and the bag. Leave the ice on for 20 minutes, 2-3 times per day. General instructions Take over-the-counter and prescription medicines only as told by your health care provider. Rest your wrist from any activity that may be causing your pain. If your condition is work related, talk with your employer about  changes that can be made, such as getting a wrist pad to use while typing. Do any exercises as told by your health care provider, physical therapist, or occupational therapist. Keep all follow-up visits as told by your health care provider. This is  important. Contact a health care provider if: You have new symptoms. Your pain is not controlled with medicines. Your symptoms get worse. Get help right away if: You have severe numbness or tingling in your wrist or hand. Summary Carpal tunnel syndrome is a condition that causes pain in your hand and arm. It is usually caused by repeated wrist motions. Lifestyle changes and medicines are used to treat carpal tunnel syndrome. Surgery may be recommended. Follow your health care provider's instructions about wearing a splint, resting from activity, keeping follow-up visits, and calling for help. This information is not intended to replace advice given to you by your health care provider. Make sure you discuss any questions you have with your health care provider. Document Released: 02/21/2000 Document Revised: 07/02/2017 Document Reviewed: 07/02/2017 Elsevier Patient Education  2020 [employer]  or infection inside the uterus (chorioamnionitis).  Have a shorter-than-normal cervix.  Have gone into preterm labor before.  Have had surgery on their cervix.  Are younger than age 30 or older than age 30.  Are African American.  Are pregnant with twins or multiple babies (multiple gestation).  Take street drugs or smoke while pregnant.  Do not gain enough weight while pregnant.  Became pregnant shortly after having been pregnant. What are the symptoms of preterm labor? Symptoms of preterm labor include:  Cramps similar to those that can happen during a menstrual period. The cramps may happen with diarrhea.  Pain in the abdomen or lower back.  Regular uterine contractions that may feel like tightening of the abdomen.  A feeling of increased pressure in the pelvis.  Increased watery or bloody mucus discharge from the vagina.  Water breaking (ruptured amniotic sac). Why is it important to recognize signs of preterm labor? It is important to recognize signs of preterm labor  because babies who are born prematurely may not be fully developed. This can put them at an increased risk for:  Long-term (chronic) heart and lung problems.  Difficulty immediately after birth with regulating body systems, including blood sugar, body temperature, heart rate, and breathing rate.  Bleeding in the brain.  Cerebral palsy.  Learning difficulties.  Death. These risks are highest for babies who are born before 34 weeks of pregnancy. How is preterm labor treated? Treatment depends on the length of your pregnancy, your condition, and the health of your baby. It may involve:  Having a stitch (suture) placed in your cervix to prevent your cervix from opening too early (cerclage).  Taking or being given medicines, such as: ? Hormone medicines. These may be given early in pregnancy to help support the pregnancy. ? Medicine to stop contractions. ? Medicines to help mature the babys lungs. These may be prescribed if the risk of delivery is high. ? Medicines to prevent your baby from developing cerebral palsy. If the labor happens before 34 weeks of pregnancy, you may need to stay in the hospital. What should I do if I think I am in preterm labor? If you think that you are going into preterm labor, call your health care provider right away. How can I prevent preterm labor in future pregnancies? To increase your chance of having a full-term pregnancy:  Do not use any tobacco products, such as  cigarettes, chewing tobacco, and e-cigarettes. If you need help quitting, ask your health care provider.  Do not use street drugs or medicines that have not been prescribed to you during your pregnancy.  Talk with your health care provider before taking any herbal supplements, even if you have been taking them regularly.  Make sure you gain a healthy amount of weight during your pregnancy.  Watch for infection. If you think that you might have an infection, get it checked right  away.  Make sure to tell your health care provider if you have gone into preterm labor before. This information is not intended to replace advice given to you by your health care provider. Make sure you discuss any questions you have with your health care provider. Document Released: 05/16/2003 Document Revised: 06/17/2018 Document Reviewed: 07/17/2015 Elsevier Patient Education  [place]. Preeclampsia and Eclampsia Preeclampsia is a serious condition that may develop during pregnancy. This condition causes high blood pressure and increased protein in your urine along with other symptoms, such as headaches and vision changes. These symptoms may develop as the condition gets worse. Preeclampsia may occur at 20 weeks of pregnancy or later. Diagnosing and treating preeclampsia early is very important. If not treated early, it can cause serious problems for you and your baby. One problem it can lead to is eclampsia. Eclampsia is a condition that causes muscle jerking or shaking (convulsions or seizures) and other serious problems for the mother. During pregnancy, delivering your baby may be the best treatment for preeclampsia or eclampsia. For most women, preeclampsia and eclampsia symptoms go away after giving birth. In rare cases, a woman may develop preeclampsia after giving birth (postpartum preeclampsia). This usually occurs within 48 hours after childbirth but may occur up to 6 weeks after giving birth. What are the causes? The cause of preeclampsia is not known. What increases the risk? The following risk factors make you more likely to develop preeclampsia:  Being pregnant for the first time.  Having had preeclampsia during a past pregnancy.  Having a family history of preeclampsia.  Having high blood pressure.  Being pregnant with more than one baby.  Being 64 or older.  Being African-American.  Having kidney disease or diabetes.  Having medical conditions such as lupus  or blood diseases.  Being very overweight (obese). What are the signs or symptoms? The most common symptoms are:  Severe headaches.  Vision problems, such as blurred or double vision.  Abdominal pain, especially upper abdominal pain. Other symptoms that may develop as the condition gets worse include:  Sudden weight gain.  Sudden swelling of the hands, face, legs, and feet.  Severe nausea and vomiting.  Numbness in the face, arms, legs, and feet.  Dizziness.  Urinating less than usual.  Slurred speech.  Convulsions or seizures. How is this diagnosed? There are no screening tests for preeclampsia. Your health care provider will ask you about symptoms and check for signs of preeclampsia during your prenatal visits. You may also have tests that include:  Checking your blood pressure.  Urine tests to check for protein. Your health care provider will check for this at every prenatal visit.  Blood tests.  Monitoring your baby's heart rate.  Ultrasound. How is this treated? You and your health care provider will determine the treatment approach that is best for you. Treatment may include:  Having more frequent prenatal exams to check for signs of preeclampsia, if you have an increased risk for preeclampsia.  Medicine to  lower your blood pressure.  Staying in the hospital, if your condition is severe. There, treatment will focus on controlling your blood pressure and the amount of fluids in your body (fluid retention).  Taking medicine (magnesium sulfate) to prevent seizures. This may be given as an injection or through an IV.  Taking a low-dose aspirin during your pregnancy.  Delivering your baby early. You may have your labor started with medicine (induced), or you may have a cesarean delivery. Follow these instructions at home: Eating and drinking   Drink enough fluid to keep your urine pale yellow.  Avoid caffeine. Lifestyle  Do not use any products that  contain nicotine or tobacco, such as cigarettes and e-cigarettes. If you need help quitting, ask your health care provider.  Do not use alcohol or drugs.  Avoid stress as much as possible. Rest and get plenty of sleep. General instructions  Take over-the-counter and prescription medicines only as told by your health care provider.  When lying down, lie on your left side. This keeps pressure off your major blood vessels.  When sitting or lying down, raise (elevate) your feet. Try putting some pillows underneath your lower legs.  Exercise regularly. Ask your health care provider what kinds of exercise are best for you.  Keep all follow-up and prenatal visits as told by your health care provider. This is important. How is this prevented? There is no known way of preventing preeclampsia or eclampsia from developing. However, to lower your risk of complications and detect problems early:  Get regular prenatal care. Your health care provider may be able to diagnose and treat the condition early.  Maintain a healthy weight. Ask your health care provider for help managing weight gain during pregnancy.  Work with your health care provider to manage any long-term (chronic) health conditions you have, such as diabetes or kidney problems.  You may have tests of your blood pressure and kidney function after giving birth.  Your health care provider may have you take low-dose aspirin during your next pregnancy. Contact a health care provider if:  You have symptoms that your health care provider told you may require more treatment or monitoring, such as: ? Headaches. ? Nausea or vomiting. ? Abdominal pain. ? Dizziness. ? Light-headedness. Get help right away if:  You have severe: ? Abdominal pain. ? Headaches that do not get better. ? Dizziness. ? Vision problems. ? Confusion. ? Nausea or vomiting.  You have any of the following: ? A seizure. ? Sudden, rapid weight gain. ? Sudden  swelling in your hands, ankles, or face. ? Trouble moving any part of your body. ? Numbness in any part of your body. ? Trouble speaking. ? Abnormal bleeding.  You faint. Summary  Preeclampsia is a serious condition that may develop during pregnancy.  This condition causes high blood pressure and increased protein in your urine along with other symptoms, such as headaches and vision changes.  Diagnosing and treating preeclampsia early is very important. If not treated early, it can cause serious problems for you and your baby.  Get help right away if you have symptoms that your health care provider told you to watch for. This information is not intended to replace advice given to you by your health care provider. Make sure you discuss any questions you have with your health care provider. Document Released: 02/21/2000 Document Revised: 10/26/2017 Document Reviewed: 09/30/2015 Elsevier Patient Education  2020 [employer]

## 2018-09-15 ENCOUNTER — Encounter: Payer: Medicaid Other | Admitting: Family Medicine

## 2018-09-15 ENCOUNTER — Ambulatory Visit (INDEPENDENT_AMBULATORY_CARE_PROVIDER_SITE_OTHER): Payer: Medicaid Other | Admitting: Family Medicine

## 2018-09-15 VITALS — Wt 253.0 lb

## 2018-09-15 DIAGNOSIS — O9989 Other specified diseases and conditions complicating pregnancy, childbirth and the puerperium: Secondary | ICD-10-CM

## 2018-09-15 DIAGNOSIS — Z2839 Other underimmunization status: Secondary | ICD-10-CM

## 2018-09-15 DIAGNOSIS — O09299 Supervision of pregnancy with other poor reproductive or obstetric history, unspecified trimester: Secondary | ICD-10-CM

## 2018-09-15 DIAGNOSIS — Z3A33 33 weeks gestation of pregnancy: Secondary | ICD-10-CM | POA: Diagnosis not present

## 2018-09-15 DIAGNOSIS — O09293 Supervision of pregnancy with other poor reproductive or obstetric history, third trimester: Secondary | ICD-10-CM

## 2018-09-15 DIAGNOSIS — Z283 Underimmunization status: Secondary | ICD-10-CM

## 2018-09-15 DIAGNOSIS — A6004 Herpesviral vulvovaginitis: Secondary | ICD-10-CM | POA: Diagnosis not present

## 2018-09-15 LAB — CULTURE, OB URINE

## 2018-09-15 MED ORDER — ASPIRIN EC 81 MG PO TBEC: 81.0000 mg | DELAYED_RELEASE_TABLET | Freq: Every day | ORAL | 2 refills | Status: DC

## 2018-09-15 NOTE — Progress Notes (Signed)
   TELEHEALTH VIRTUAL OBSTETRICS VISIT ENCOUNTER NOTE  I connected with Kara Sanchez on 09/15/18 at  4:00 PM EDT by telephone at home and verified that I am speaking with the correct person using two identifiers.   I discussed the limitations, risks, security and privacy concerns of performing an evaluation and management service by telephone and the availability of in person appointments. I also discussed with the patient that there may be a patient responsible charge related to this service. The patient expressed understanding and agreed to proceed.  Subjective:  Kara Sanchez is a 27 y.o. [id number] at 96w5dbeing followed for ongoing prenatal care.  She is currently monitored for the following issues for this high-risk pregnancy and has Pregnancy; Supervision of pregnancy with other poor reproductive or obstetric history, unspecified trimester; Rubella non-immune status, antepartum; High risk HPV infection; Herpes simplex vulvovaginitis; Drug use affecting pregnancy; and History of pre-eclampsia in prior pregnancy, currently pregnant on their problem list.  Patient reports no complaints. Reports fetal movement. Denies any contractions, bleeding or leaking of fluid.   The following portions of the patient's history were reviewed and updated as appropriate: allergies, current medications, past family history, past medical history, past social history, past surgical history and problem list.   Objective:   General:  Alert, oriented and cooperative.   Mental Status: Normal mood and affect perceived. Normal judgment and thought content.  Rest of physical exam deferred due to type of encounter  Assessment and Plan:  Pregnancy: [id number] 321w5d. Supervision of pregnancy with other poor reproductive or obstetric history, unspecified trimester Fetal movement normal. Needs 2hr GTT. Will get this scheduled ASAP  2. Rubella non-immune status, antepartum MMR post delivery  3. History of pre-eclampsia  in prior pregnancy, currently pregnant Monitor BP  4. Herpes simplex vulvovaginitis On Valtrex   Preterm labor symptoms and general obstetric precautions including but not limited to vaginal bleeding, contractions, leaking of fluid and fetal movement were reviewed in detail with the patient.  I discussed the assessment and treatment plan with the patient. The patient was provided an opportunity to ask questions and all were answered. The patient agreed with the plan and demonstrated an understanding of the instructions. The patient was advised to call back or seek an in-person office evaluation/[facility] concerning [facility]. Please refer to After Visit Summary for other counseling recommendations.   I provided 13 minutes of non-face-to-face time during this encounter.  No follow-ups on file.  No future appointments.  JaLa Plantor [facility]

## 2018-09-19 ENCOUNTER — Other Ambulatory Visit: Payer: Medicaid Other

## 2018-09-19 ENCOUNTER — Other Ambulatory Visit: Payer: Self-pay

## 2018-09-19 ENCOUNTER — Encounter: Payer: Self-pay | Admitting: Obstetrics & Gynecology

## 2018-09-19 DIAGNOSIS — O09299 Supervision of pregnancy with other poor reproductive or obstetric history, unspecified trimester: Secondary | ICD-10-CM

## 2018-09-19 NOTE — Progress Notes (Signed)
Patient presents for gtt ( late prenatal care)sent to lab. Kathrene Alu RN

## 2018-09-21 ENCOUNTER — Other Ambulatory Visit: Payer: Self-pay | Admitting: Family Medicine

## 2018-09-21 DIAGNOSIS — O09299 Supervision of pregnancy with other poor reproductive or obstetric history, unspecified trimester: Secondary | ICD-10-CM

## 2018-09-21 LAB — OBSTETRIC PANEL, INCLUDING HIV
Antibody Screen: NEGATIVE
Basophils Absolute: 0 10*3/uL (ref 0.0–0.2)
Basos: 0 %
EOS (ABSOLUTE): 0.2 10*3/uL (ref 0.0–0.4)
Eos: 2 %
HIV Screen 4th Generation wRfx: NONREACTIVE
Hematocrit: 36.1 % (ref 34.0–46.6)
Hemoglobin: 11.7 g/dL (ref 11.1–15.9)
Hepatitis B Surface Ag: NEGATIVE
Immature Grans (Abs): 0.1 10*3/uL (ref 0.0–0.1)
Immature Granulocytes: 1 %
Lymphocytes Absolute: 1.5 10*3/uL (ref 0.7–3.1)
Lymphs: 20 %
MCH: 24 pg — ABNORMAL LOW (ref 26.6–33.0)
MCHC: 32.4 g/dL (ref 31.5–35.7)
MCV: 74 fL — ABNORMAL LOW (ref 79–97)
Monocytes Absolute: 0.5 10*3/uL (ref 0.1–0.9)
Monocytes: 6 %
Neutrophils Absolute: 5.4 10*3/uL (ref 1.4–7.0)
Neutrophils: 71 %
Platelets: 249 10*3/uL (ref 150–450)
RBC: 4.88 x10E6/uL (ref 3.77–5.28)
RDW: 14.5 % (ref 11.7–15.4)
RPR Ser Ql: NONREACTIVE
Rh Factor: POSITIVE
Rubella Antibodies, IGG: <0.9 index — ABNORMAL LOW (ref >0.99)
WBC: 7.6 10*3/uL (ref 3.4–10.8)

## 2018-09-21 LAB — GLUCOSE TOLERANCE, 2 HOURS W/ 1HR
Glucose, 1 hour: 108 mg/dL (ref 65–179)
Glucose, 2 hour: 66 mg/dL (ref 65–152)
Glucose, Fasting: 75 mg/dL (ref 65–91)

## 2018-09-27 ENCOUNTER — Encounter: Payer: Medicaid Other | Admitting: Advanced Practice Midwife

## 2018-10-04 ENCOUNTER — Other Ambulatory Visit: Payer: Self-pay

## 2018-10-04 ENCOUNTER — Other Ambulatory Visit (HOSPITAL_COMMUNITY)
Admission: RE | Admit: 2018-10-04 | Discharge: 2018-10-04 | Disposition: A | Discharge status: Home / Self Care | Payer: Medicaid Other | Source: Ambulatory Visit | Attending: Advanced Practice Midwife | Admitting: Advanced Practice Midwife

## 2018-10-04 ENCOUNTER — Encounter: Payer: Self-pay | Admitting: Advanced Practice Midwife

## 2018-10-04 ENCOUNTER — Ambulatory Visit (INDEPENDENT_AMBULATORY_CARE_PROVIDER_SITE_OTHER): Payer: Medicaid Other | Admitting: Advanced Practice Midwife

## 2018-10-04 VITALS — BP 125/68 | HR 105 | Wt 251.0 lb

## 2018-10-04 DIAGNOSIS — Z3A36 36 weeks gestation of pregnancy: Secondary | ICD-10-CM

## 2018-10-04 DIAGNOSIS — O09299 Supervision of pregnancy with other poor reproductive or obstetric history, unspecified trimester: Secondary | ICD-10-CM

## 2018-10-04 DIAGNOSIS — O09899 Supervision of other high risk pregnancies, unspecified trimester: Secondary | ICD-10-CM | POA: Insufficient documentation

## 2018-10-04 DIAGNOSIS — O09893 Supervision of other high risk pregnancies, third trimester: Secondary | ICD-10-CM

## 2018-10-04 DIAGNOSIS — O09293 Supervision of pregnancy with other poor reproductive or obstetric history, third trimester: Secondary | ICD-10-CM

## 2018-10-04 NOTE — Patient Instructions (Signed)

## 2018-10-04 NOTE — Progress Notes (Signed)
PRENATAL VISIT NOTE  Subjective:  Kara Sanchez is a 28 y.o. [id number] at 24w3dbeing seen today for ongoing prenatal care.  She is currently monitored for the following issues for this high-risk pregnancy and has Pregnancy; Supervision of pregnancy with other poor reproductive or obstetric history, unspecified trimester; Rubella non-immune status, antepartum; High risk HPV infection; Herpes simplex vulvovaginitis; Drug use affecting pregnancy; and History of pre-eclampsia in prior pregnancy, currently pregnant on their problem list.  Patient reports Feeling pressure and pubic bone pain.  Contractions: Not present. Vag. Bleeding: None.  Movement: Present. Denies leaking of fluid.   The following portions of the patient's history were reviewed and updated as appropriate: allergies, current medications, past family history, past medical history, past social history, past surgical history and problem list.   Objective:   Vitals:   10/04/18 1016  BP: 125/68  Pulse: (!) 105  Weight: 113.9 kg    Fetal Status:     Movement: Present     General:  Alert, oriented and cooperative. Patient is in no acute distress.  Skin: Skin is warm and dry. No rash noted.   Cardiovascular: Normal heart rate noted  Respiratory: Normal respiratory effort, no problems with respiration noted  Abdomen: Soft, gravid, appropriate for gestational age.  Pain/Pressure: Present     Pelvic: Cervical exam performed      1-2/70/-3/vtx  Extremities: Normal range of motion.  Edema: Trace  Mental Status: Normal mood and affect. Normal behavior. Normal judgment and thought content.   Assessment and Plan:  Pregnancy: [id number] 339w3d.1. Supervision of pregnancy with other poor reproductive or obstetric history, unspecified trimester   2. Rubella non-immune status, antepartum MMR post delivery  3. History of pre-eclampsia in prior pregnancy, currently pregnant Monitor BP, cuff at home reads higher than office or  hospital Preeclampsia signs reviewed  4. Herpes simplex vulvovaginitis On Valtrex  Preterm labor symptoms and general obstetric precautions including but not limited to vaginal bleeding, contractions, leaking of fluid and fetal movement were reviewed in detail with the patient. Please refer to After Visit Summary for other counseling recommendations.     MaHansel FeinsteinCNM

## 2018-10-05 LAB — GC/CHLAMYDIA PROBE AMP (~~LOC~~) NOT AT ARMC
Chlamydia: NEGATIVE
Neisseria Gonorrhea: NEGATIVE

## 2018-10-08 LAB — CULTURE, BETA STREP (GROUP B ONLY): Strep Gp B Culture: NEGATIVE

## 2018-10-11 ENCOUNTER — Other Ambulatory Visit: Payer: Self-pay

## 2018-10-11 ENCOUNTER — Encounter: Payer: Self-pay | Admitting: Advanced Practice Midwife

## 2018-10-11 ENCOUNTER — Ambulatory Visit (INDEPENDENT_AMBULATORY_CARE_PROVIDER_SITE_OTHER): Payer: Medicaid Other | Admitting: Advanced Practice Midwife

## 2018-10-11 VITALS — BP 122/68 | HR 111 | Wt 251.0 lb

## 2018-10-11 DIAGNOSIS — O09299 Supervision of pregnancy with other poor reproductive or obstetric history, unspecified trimester: Secondary | ICD-10-CM

## 2018-10-11 DIAGNOSIS — O09293 Supervision of pregnancy with other poor reproductive or obstetric history, third trimester: Secondary | ICD-10-CM

## 2018-10-11 DIAGNOSIS — Z3A37 37 weeks gestation of pregnancy: Secondary | ICD-10-CM

## 2018-10-11 NOTE — Progress Notes (Signed)
   PRENATAL VISIT NOTE  Subjective:  Kara Sanchez is a 28 y.o. [id number] at [redacted]w[redacted]d being seen today for ongoing prenatal care.  She is currently monitored for the following issues for this low-risk pregnancy and has Pregnancy; Supervision of pregnancy with other poor reproductive or obstetric history, unspecified trimester; Rubella non-immune status, antepartum; High risk HPV infection; Bacterial vaginosis; Drug use affecting pregnancy; History of pre-eclampsia in prior pregnancy, currently pregnant; Genital herpes simplex type 2; Moderate episode of recurrent major depressive disorder ([place]); and Obesity on their problem list.  Patient reports occasional contractions.  Contractions: Irritability. Vag. Bleeding: None.  Movement: Present. Denies leaking of fluid.   The following portions of the patient's history were reviewed and updated as appropriate: allergies, current medications, past family history, past medical history, past social history, past surgical history and problem list.   Objective:   Vitals:   10/11/18 0953  BP: 122/68  Pulse: (!) 111  Weight: 113.9 kg    Fetal Status: Fetal Heart Rate (bpm): 161   Movement: Present     General:  Alert, oriented and cooperative. Patient is in no acute distress.  Skin: Skin is warm and dry. No rash noted.   Cardiovascular: Normal heart rate noted  Respiratory: Normal respiratory effort, no problems with respiration noted  Abdomen: Soft, gravid, appropriate for gestational age.  Pain/Pressure: Present     Pelvic: Cervical exam performed       1.60.-3/vtx  Extremities: Normal range of motion.  Edema: Trace  Mental Status: Normal mood and affect. Normal behavior. Normal judgment and thought content.   Assessment and Plan:  Pregnancy: [id number] at [redacted]w[redacted]d There are no diagnoses linked to this encounter. Term labor symptoms and general obstetric precautions including but not limited to vaginal bleeding, contractions, leaking of fluid and fetal  movement were reviewed in detail with the patient. Please refer to After Visit Summary for other counseling recommendations.   No follow-ups on file.  Future Appointments  Date Time Provider [place]  10/18/2018  8:30 AM Seabron Spates, CNM [facility] None    Hansel Feinstein, CNM

## 2018-10-11 NOTE — Patient Instructions (Signed)

## 2018-10-11 NOTE — Progress Notes (Deleted)
  Subjective:    Kara Sanchez is being seen today for her first obstetrical visit.  This {is/is not:9024} a planned pregnancy. She is at [redacted]w[redacted]d gestation. Her obstetrical history is significant for {ob risk factors:10154}. Relationship with FOB: {fob:16621}. Patient {does/does not:19097} intend to breast feed. Pregnancy history fully reviewed.  Patient reports {sx:14538}.  Review of Systems:   Review of Systems  Objective:     BP 122/68   Pulse (!) 111   Wt 113.9 kg   LMP 02/02/2018 (Exact Date)   BMI 38.16 kg/m  Physical Exam  Exam    Assessment:    Pregnancy: [id number] Patient Active Problem List   Diagnosis Date Noted  . History of pre-eclampsia in prior pregnancy, currently pregnant 09/15/2018  . Drug use affecting pregnancy 04/27/2018  . Rubella non-immune status, antepartum 04/14/2018  . Pregnancy 04/12/2018  . High risk HPV infection 10/03/2015  . Supervision of pregnancy with other poor reproductive or obstetric history, unspecified trimester 09/09/2015  . Moderate episode of recurrent major depressive disorder ([place]) 09/13/2014  . Genital herpes simplex type 2 02/12/2014  . Bacterial vaginosis 10/16/2013  . Obesity 05/21/2011       Plan:     Initial labs drawn. Prenatal vitamins. Problem list reviewed and updated. AFP3 discussed: {requests/ordered/declines:14581}. Role of ultrasound in pregnancy discussed; fetal survey: {requests/ordered/declines:14581}. Amniocentesis discussed: {amniocentesis:14582}. Follow up in {numbers 0-4:31231} weeks. ***% of *** min visit spent on counseling and coordination of care.  ***   Hansel Feinstein 10/11/2018

## 2018-10-13 ENCOUNTER — Telehealth: Payer: Self-pay

## 2018-10-13 NOTE — Telephone Encounter (Signed)
Pt called the office stating that after her membranes were stripped on 10/11/18, she noticed  a small amount of fluid and noticed mucus when she wiped herself after using the restroom. Pt states she thought her water broke. Pt made aware that what she is experiencing is normal after having her membranes stripped. Pt advised to go to [facility] if she notices a gush of fluids. Understanding was voiced.  chiquita l wilson, CMA

## 2018-10-18 ENCOUNTER — Other Ambulatory Visit: Payer: Self-pay | Admitting: Advanced Practice Midwife

## 2018-10-18 ENCOUNTER — Other Ambulatory Visit: Payer: Self-pay

## 2018-10-18 ENCOUNTER — Encounter: Payer: Self-pay | Admitting: Advanced Practice Midwife

## 2018-10-18 ENCOUNTER — Ambulatory Visit (INDEPENDENT_AMBULATORY_CARE_PROVIDER_SITE_OTHER): Payer: Medicaid Other | Admitting: Advanced Practice Midwife

## 2018-10-18 DIAGNOSIS — O09293 Supervision of pregnancy with other poor reproductive or obstetric history, third trimester: Secondary | ICD-10-CM

## 2018-10-18 DIAGNOSIS — Z3A38 38 weeks gestation of pregnancy: Secondary | ICD-10-CM

## 2018-10-18 NOTE — Patient Instructions (Signed)

## 2018-10-18 NOTE — Progress Notes (Signed)
Induction orders placed

## 2018-10-18 NOTE — Progress Notes (Signed)
  Subjective:     PRENATAL VISIT NOTE  Subjective:  Kara Sanchez is a 28 y.o. [id number] at [redacted]w[redacted]d being seen today for ongoing prenatal care.  She is currently monitored for the following issues for this high-risk pregnancy and has Pregnancy; Supervision of pregnancy with other poor reproductive or obstetric history, unspecified trimester; Rubella non-immune status, antepartum; High risk HPV infection; Bacterial vaginosis; Drug use affecting pregnancy; History of pre-eclampsia in prior pregnancy, currently pregnant; Genital herpes simplex type 2; Moderate episode of recurrent major depressive disorder ([place]); and Obesity on their problem list.  Patient reports occasional contractions.   .  .   . Denies leaking of fluid.   Wants to be induced "because Im tired and uncomfortable"     The following portions of the patient's history were reviewed and updated as appropriate: allergies, current medications, past family history, past medical history, past social history, past surgical history and problem list.   Objective:   Vitals:   10/18/18 0846  Weight: 115.7 kg   Vitals:   10/18/18 0846 10/18/18 0851  BP: (!) 121/41 106/68  Pulse: (!) 103 100    Fetal Status:           General:  Alert, oriented and cooperative. Patient is in no acute distress.  Skin: Skin is warm and dry. No rash noted.   Cardiovascular: Normal heart rate noted  Respiratory: Normal respiratory effort, no problems with respiration noted  Abdomen: Soft, gravid, appropriate for gestational age.        Pelvic: Cervical exam deferred        Extremities: Normal range of motion.     Mental Status: Normal mood and affect. Normal behavior. Normal judgment and thought content.   Assessment and Plan:  Pregnancy: [id number] at [redacted]w[redacted]d Patient Active Problem List   Diagnosis Date Noted  . History of pre-eclampsia in prior pregnancy, currently pregnant 09/15/2018  . Drug use affecting pregnancy 04/27/2018  . Rubella non-immune  status, antepartum 04/14/2018  . Pregnancy 04/12/2018  . High risk HPV infection 10/03/2015  . Supervision of pregnancy with other poor reproductive or obstetric history, unspecified trimester 09/09/2015  . Moderate episode of recurrent major depressive disorder ([place]) 09/13/2014  . Genital herpes simplex type 2 02/12/2014  . Bacterial vaginosis 10/16/2013  . Obesity 05/21/2011   BP within normal limits today Discussed risks of elective induction of labor   Will consult re: appropriateness of IOL.  Term labor symptoms and general obstetric precautions including but not limited to vaginal bleeding, contractions, leaking of fluid and fetal movement were reviewed in detail with the patient. Please refer to After Visit Summary for other counseling recommendations.    Hansel Feinstein, CNM

## 2018-10-19 ENCOUNTER — Telehealth (HOSPITAL_COMMUNITY): Payer: Self-pay | Admitting: *Deleted

## 2018-10-19 ENCOUNTER — Encounter (HOSPITAL_COMMUNITY): Payer: Self-pay | Admitting: *Deleted

## 2018-10-19 NOTE — Telephone Encounter (Signed)
Preadmission screen  

## 2018-10-20 ENCOUNTER — Other Ambulatory Visit: Payer: Self-pay

## 2018-10-20 ENCOUNTER — Other Ambulatory Visit (HOSPITAL_COMMUNITY)
Admission: RE | Admit: 2018-10-20 | Discharge: 2018-10-20 | Disposition: A | Discharge status: Home / Self Care | Payer: Medicaid Other | Source: Ambulatory Visit | Attending: Obstetrics & Gynecology | Admitting: Obstetrics & Gynecology

## 2018-10-20 DIAGNOSIS — Z20828 Contact with and (suspected) exposure to other viral communicable diseases: Secondary | ICD-10-CM | POA: Diagnosis present

## 2018-10-20 LAB — SARS CORONAVIRUS 2 (TAT 6-24 HRS): SARS Coronavirus 2: NEGATIVE

## 2018-10-20 NOTE — MAU Note (Signed)
Asymptomatic, swab collected. 

## 2018-10-22 ENCOUNTER — Encounter (HOSPITAL_COMMUNITY): Payer: Self-pay | Admitting: *Deleted

## 2018-10-22 ENCOUNTER — Inpatient Hospital Stay (HOSPITAL_COMMUNITY): Payer: Medicaid Other

## 2018-10-22 ENCOUNTER — Inpatient Hospital Stay (HOSPITAL_COMMUNITY): Payer: Medicaid Other | Admitting: Anesthesiology

## 2018-10-22 ENCOUNTER — Inpatient Hospital Stay (HOSPITAL_COMMUNITY)
Admission: AD | Admit: 2018-10-22 | Discharge: 2018-10-25 | DRG: 806 | Disposition: A | Discharge status: Home / Self Care | Payer: Medicaid Other | Attending: Family Medicine | Admitting: Family Medicine

## 2018-10-22 ENCOUNTER — Other Ambulatory Visit: Payer: Self-pay | Admitting: Advanced Practice Midwife

## 2018-10-22 DIAGNOSIS — Z3A39 39 weeks gestation of pregnancy: Secondary | ICD-10-CM

## 2018-10-22 DIAGNOSIS — O9832 Other infections with a predominantly sexual mode of transmission complicating childbirth: Secondary | ICD-10-CM | POA: Diagnosis present

## 2018-10-22 DIAGNOSIS — Z3490 Encounter for supervision of normal pregnancy, unspecified, unspecified trimester: Secondary | ICD-10-CM | POA: Diagnosis present

## 2018-10-22 DIAGNOSIS — Z3689 Encounter for other specified antenatal screening: Secondary | ICD-10-CM

## 2018-10-22 DIAGNOSIS — Z880 Allergy status to penicillin: Secondary | ICD-10-CM | POA: Diagnosis not present

## 2018-10-22 DIAGNOSIS — A6000 Herpesviral infection of urogenital system, unspecified: Secondary | ICD-10-CM | POA: Diagnosis present

## 2018-10-22 DIAGNOSIS — Z87891 Personal history of nicotine dependence: Secondary | ICD-10-CM

## 2018-10-22 DIAGNOSIS — Z3A38 38 weeks gestation of pregnancy: Secondary | ICD-10-CM

## 2018-10-22 DIAGNOSIS — O26893 Other specified pregnancy related conditions, third trimester: Secondary | ICD-10-CM | POA: Diagnosis present

## 2018-10-22 LAB — CBC
HCT: 34.7 % — ABNORMAL LOW (ref 36.0–46.0)
Hemoglobin: 11.3 g/dL — ABNORMAL LOW (ref 12.0–15.0)
MCH: 23.9 pg — ABNORMAL LOW (ref 26.0–34.0)
MCHC: 32.6 g/dL (ref 30.0–36.0)
MCV: 73.4 fL — ABNORMAL LOW (ref 80.0–100.0)
Platelets: 282 10*3/uL (ref 150–400)
RBC: 4.73 MIL/uL (ref 3.87–5.11)
RDW: 14.6 % (ref 11.5–15.5)
WBC: 7 10*3/uL (ref 4.0–10.5)
nRBC: 0 % (ref 0.0–0.2)

## 2018-10-22 LAB — RAPID URINE DRUG SCREEN, HOSP PERFORMED
Amphetamines: NOT DETECTED
Barbiturates: NOT DETECTED
Benzodiazepines: NOT DETECTED
Cocaine: NOT DETECTED
Opiates: NOT DETECTED
Tetrahydrocannabinol: NOT DETECTED

## 2018-10-22 LAB — TYPE AND SCREEN
ABO/RH(D): B POS
Antibody Screen: NEGATIVE

## 2018-10-22 LAB — ABO/RH: ABO/RH(D): B POS

## 2018-10-22 MED ORDER — LIDOCAINE HCL (PF) 1 % IJ SOLN: 30.0000 mL | INTRAMUSCULAR | Status: DC | PRN

## 2018-10-22 MED ORDER — SODIUM CHLORIDE (PF) 0.9 % IJ SOLN
INTRAMUSCULAR | Status: DC | PRN
  Administered 2018-10-22: 12 mL/h via EPIDURAL

## 2018-10-22 MED ORDER — PHENYLEPHRINE 40 MCG/ML (10ML) SYRINGE FOR IV PUSH (FOR BLOOD PRESSURE SUPPORT)
80.0000 ug | PREFILLED_SYRINGE | INTRAVENOUS | Status: DC | PRN
  Filled 2018-10-22: qty 10

## 2018-10-22 MED ORDER — FENTANYL CITRATE (PF) 100 MCG/2ML IJ SOLN
INTRAMUSCULAR | Status: AC
  Filled 2018-10-22: qty 2

## 2018-10-22 MED ORDER — OXYTOCIN 40 UNITS IN NORMAL SALINE INFUSION - SIMPLE MED
1.0000 m[IU]/min | INTRAVENOUS | Status: DC
  Administered 2018-10-22: 2 m[IU]/min via INTRAVENOUS
  Filled 2018-10-22: qty 1000

## 2018-10-22 MED ORDER — OXYTOCIN 40 UNITS IN NORMAL SALINE INFUSION - SIMPLE MED
2.5000 [IU]/h | INTRAVENOUS | Status: DC
  Administered 2018-10-23: 01:00:00 2.5 [IU]/h via INTRAVENOUS

## 2018-10-22 MED ORDER — OXYCODONE-ACETAMINOPHEN 5-325 MG PO TABS: 2.0000 | ORAL_TABLET | ORAL | Status: DC | PRN

## 2018-10-22 MED ORDER — PHENYLEPHRINE 40 MCG/ML (10ML) SYRINGE FOR IV PUSH (FOR BLOOD PRESSURE SUPPORT)
80.0000 ug | PREFILLED_SYRINGE | INTRAVENOUS | Status: DC | PRN
  Administered 2018-10-22: 80 ug via INTRAVENOUS

## 2018-10-22 MED ORDER — EPHEDRINE 5 MG/ML INJ: 10.0000 mg | INTRAVENOUS | Status: DC | PRN

## 2018-10-22 MED ORDER — LACTATED RINGERS IV SOLN
INTRAVENOUS | Status: DC
  Administered 2018-10-22 (×2): via INTRAVENOUS

## 2018-10-22 MED ORDER — ONDANSETRON HCL 4 MG/2ML IJ SOLN
4.0000 mg | Freq: Four times a day (QID) | INTRAMUSCULAR | Status: DC | PRN
  Administered 2018-10-22: 19:00:00 4 mg via INTRAVENOUS
  Filled 2018-10-22: qty 2

## 2018-10-22 MED ORDER — OXYTOCIN BOLUS FROM INFUSION
500.0000 mL | Freq: Once | INTRAVENOUS | Status: AC
  Administered 2018-10-23: 500 mL via INTRAVENOUS

## 2018-10-22 MED ORDER — DIPHENHYDRAMINE HCL 50 MG/ML IJ SOLN: 12.5000 mg | INTRAMUSCULAR | Status: DC | PRN

## 2018-10-22 MED ORDER — OXYCODONE-ACETAMINOPHEN 5-325 MG PO TABS: 1.0000 | ORAL_TABLET | ORAL | Status: DC | PRN

## 2018-10-22 MED ORDER — LIDOCAINE HCL (PF) 1 % IJ SOLN
INTRAMUSCULAR | Status: DC | PRN
  Administered 2018-10-22: 11 mL via EPIDURAL

## 2018-10-22 MED ORDER — LACTATED RINGERS IV SOLN
500.0000 mL | Freq: Once | INTRAVENOUS | Status: AC
  Administered 2018-10-22: 500 mL via INTRAVENOUS

## 2018-10-22 MED ORDER — LACTATED RINGERS IV SOLN: 500.0000 mL | INTRAVENOUS | Status: DC | PRN

## 2018-10-22 MED ORDER — FENTANYL CITRATE (PF) 100 MCG/2ML IJ SOLN
100.0000 ug | INTRAMUSCULAR | Status: DC | PRN
  Administered 2018-10-22 (×2): 100 ug via INTRAVENOUS
  Filled 2018-10-22: qty 2

## 2018-10-22 MED ORDER — SOD CITRATE-CITRIC ACID 500-334 MG/5ML PO SOLN
30.0000 mL | ORAL | Status: DC | PRN
  Administered 2018-10-22: 30 mL via ORAL
  Filled 2018-10-22: qty 30

## 2018-10-22 MED ORDER — ACETAMINOPHEN 325 MG PO TABS: 650.0000 mg | ORAL_TABLET | ORAL | Status: DC | PRN

## 2018-10-22 MED ORDER — TERBUTALINE SULFATE 1 MG/ML IJ SOLN: 0.2500 mg | Freq: Once | INTRAMUSCULAR | Status: DC | PRN

## 2018-10-22 MED ORDER — FENTANYL-BUPIVACAINE-NACL 0.5-0.125-0.9 MG/250ML-% EP SOLN
12.0000 mL/h | EPIDURAL | Status: DC | PRN
  Filled 2018-10-22: qty 250

## 2018-10-22 NOTE — Anesthesia Procedure Notes (Signed)
Epidural Patient location during procedure: OB Start time: 10/22/2018 8:18 PM End time: 10/22/2018 8:33 PM  Staffing Anesthesiologist: Lynda Rainwater, MD Performed: anesthesiologist   Preanesthetic Checklist Completed: patient identified, site marked, surgical consent, pre-op evaluation, timeout performed, IV checked, risks and benefits discussed and monitors and equipment checked  Epidural Patient position: sitting Prep: ChloraPrep Patient monitoring: heart rate, cardiac monitor, continuous pulse ox and blood pressure Approach: midline Location: L2-L3 Injection technique: LOR saline  Needle:  Needle type: Tuohy  Needle gauge: 17 G Needle length: 9 cm Needle insertion depth: 6 cm Catheter type: closed end flexible Catheter size: 20 Guage Catheter at skin depth: 10 cm Test dose: negative  Assessment Events: blood not aspirated, injection not painful, no injection resistance, negative IV test and no paresthesia  Additional Notes Reason for block:procedure for pain

## 2018-10-22 NOTE — Progress Notes (Signed)
LABOR PROGRESS NOTE  Kara Sanchez is a 28 y.o. [id number] at [redacted]w[redacted]d  admitted for elective induction.  Subjective: She is standing next to her bed listening to music. States pelvic pressure is too uncomfortable to lie down in bed. Her contractions have strengthened since starting pitocin. No other complaints currently.  Objective: BP (!) 107/55   Pulse 89   Temp 97.9 F (36.6 C) (Oral)   Resp 16   Ht 5\' 8"  (1.727 m)   Wt 116.3 kg   LMP 02/02/2018 (Exact Date)   BMI 38.97 kg/m  or  Vitals:   10/22/18 1141 10/22/18 1213 10/22/18 1243 10/22/18 1324  BP: (!) 89/71 105/60 (!) 107/55   Pulse: (!) 108 98 89   Resp: 16 16 16 16   Temp:  97.9 F (36.6 C)    TempSrc:  Oral    Weight:      Height:        Dilation: 3 Effacement (%): 60 Cervical Position: Posterior Station: -2 Presentation: Vertex Exam by:: [place], rn FHT: baseline rate 135, moderate varibility, 15x15acel, no decel, cat I strip Toco: 3-4 min  Labs: Lab Results  Component Value Date   WBC 7.0 10/22/2018   HGB 11.3 (L) 10/22/2018   HCT 34.7 (L) 10/22/2018   MCV 73.4 (L) 10/22/2018   PLT 282 10/22/2018    Patient Active Problem List   Diagnosis Date Noted  . Encounter for elective induction of labor 10/22/2018  . History of pre-eclampsia in prior pregnancy, currently pregnant 09/15/2018  . Drug use affecting pregnancy 04/27/2018  . Rubella non-immune status, antepartum 04/14/2018  . Pregnancy 04/12/2018  . High risk HPV infection 10/03/2015  . Supervision of pregnancy with other poor reproductive or obstetric history, unspecified trimester 09/09/2015  . Moderate episode of recurrent major depressive disorder ([place]) 09/13/2014  . Genital herpes simplex type 2 02/12/2014  . Bacterial vaginosis 10/16/2013  . Obesity 05/21/2011    Assessment / Plan: 28 y.o. [id number] at [redacted]w[redacted]d here for elective induction of labor. Hx of HSV on prophylaxis, no signs of an outbreak. Hx of shoulder dystocia with last pregnancy. Hx of  THC use in pregnancy, UDS ordered.  Labor: Pitocin at 10 mU/min currently. Continue to increase by 2 mU/min. Cervical exams as indicated. Fetal Wellbeing:  Cat I strip, reassuring with no indications for interventions at this time Pain Control:  Maternally supported. Epidural when desired. Anticipated MOD:  vaginal  Vanessa Kick [facility] Medical Student 10/22/2018, 1:34 PM

## 2018-10-22 NOTE — H&P (Signed)
OBSTETRIC ADMISSION HISTORY AND PHYSICAL  Kara Sanchez is a 28 y.o. female [phone] with IUP at 32w0dby ultrasound presenting for elective induction. She reports +FMs, No LOF, no VB, no blurry vision, headaches or peripheral edema, and RUQ pain.  She plans on breast feeding. She request IUD for birth control. She received her prenatal care at [facility]   Dating: By ultrasound 04/11/2018 --->  Estimated Date of Delivery: 10/29/18  Sono:    '@[redacted]w[redacted]d'$  [facility], normal anatomy, posterior placenta w/o previa    Nursing Staff Provider  Office Location  [facility] Dating  11 wk [place] Language  English Anatomy [place] normal  Flu Vaccine   Genetic Screen  NIPS:   AFP:   First Screen:  Quad:    TDaP vaccine    Hgb A1C or  GTT Early  Third trimester WNL  Rhogam     LAB RESULTS   Feeding Plan Breast Blood Type B/Positive/-- (07/13 0924)   Contraception IUD Antibody Negative (07/13 0924)  Circumcision Yes - OP Rubella <0.90 (07/13 0924)  Pediatrician  [facility] RPR Non Reactive (07/13 0924)   Support Person FOB Nathan HBsAg Negative (07/13 0924)   Prenatal Classes  HIV Non Reactive (07/13 0924)  BTL Consent  GBS  (For PCN allergy, check sensitivities)    NEG  VBAC Consent  Pap     Hgb Electro    BP Cuff  CF     SMA     Waterbirth  '[ ]'$  Class '[ ]'$  Consent '[ ]'$  CNM visit   Prenatal History/Complications:  Past Medical History: Past Medical History:  Diagnosis Date  . Hx of preeclampsia, prior pregnancy, currently pregnant   . Pregnancy induced hypertension   . Previous pregnancy complicated by pregnancy-induced hypertension, antepartum     Past Surgical History: Past Surgical History:  Procedure Laterality Date  . TONSILLECTOMY      Obstetrical History: OB History    Gravida  7   Para  4   Term  4   Preterm      AB  2   Living  4     SAB  2   TAB      Ectopic      Multiple      Live Births  4           Social History: Social  History   Socioeconomic History  . Marital status: Single    Spouse name: Not on file  . Number of children: Not on file  . Years of education: Not on file  . Highest education level: Not on file  Occupational History  . Not on file  Social Needs  . Financial resource strain: Not hard at all  . Food insecurity    Worry: Never true    Inability: Never true  . Transportation needs    Medical: No    Non-medical: Not on file  Tobacco Use  . Smoking status: Former SResearch scientist (life sciences) . Smokeless tobacco: Never Used  Substance and Sexual Activity  . Alcohol use: Never    Frequency: Never  . Drug use: Never  . Sexual activity: Yes    Birth control/protection: None, Injection  Lifestyle  . Physical activity    Days per week: Not on file    Minutes per session: Not on file  . Stress: To some extent  Relationships  . Social connections    Talks on phone: Not on file  Gets together: Not on file    Attends religious service: Not on file    Active member of club or organization: Not on file    Attends meetings of clubs or organizations: Not on file    Relationship status: Not on file  Other Topics Concern  . Not on file  Social History Narrative  . Not on file    Family History: Family History  Problem Relation Age of Onset  . Multiple sclerosis Father     Allergies: No Known Allergies  Medications Prior to Admission  Medication Sig Dispense Refill Last Dose  . AMBULATORY NON FORMULARY MEDICATION 1 Device by Other route once a week. Blood pressure cuff/ Medium  Monitored regularly at home/ ICD 10: HROB O09.09 1 kit 0   . aspirin EC 81 MG tablet Take 1 tablet (81 mg total) by mouth daily. Take after 12 weeks for prevention of preeclampsia later in pregnancy 300 tablet 2   . Prenatal MV-Min-Fe Fum-FA-DHA (PRENATAL 1) 30-0.975-200 MG CAPS Take by mouth.     . Prenatal Vit-Fe Fumarate-FA (PRENATAL VITAMINS PO) Take by mouth.     . valACYclovir (VALTREX) 500 MG tablet Take 500 mg by  mouth 2 (two) times daily.     . valACYclovir (VALTREX) 500 MG tablet Take by mouth.        Review of Systems   All systems reviewed and negative except as stated in HPI  Height 5' 8"  (1.727 m), weight 116.3 kg, last menstrual period 02/02/2018. General appearance: alert, cooperative, appears stated age and no distress Lungs: clear to auscultation bilaterally Heart: regular rate and rhythm Abdomen: soft, non-tender; bowel sounds normal Extremities: Homans sign is negative, no sign of DVT Presentation: cephalic Fetal monitoringBaseline: 140-145 bpm, Variability: Good {> 6 bpm), Accelerations: Reactive and Decelerations: Absent Uterine activityIntensity: occasional contractions     Prenatal labs: ABO, Rh: B/Positive/-- (07/13 0924) Antibody: Negative (07/13 0924) Rubella: <0.90 (07/13 0924) RPR: Non Reactive (07/13 0924)  HBsAg: Negative (07/13 0924)  HIV: Non Reactive (07/13 0924)  GBS:   negative 1 hr Glucola 108 Genetic screening  n/a Anatomy US: Per [facility] records, interval good growth w/ 3 vessel cord.  Prenatal Transfer Tool  Maternal Diabetes: No Genetic Screening: n/a Maternal Ultrasounds/Referrals: Normal Fetal Ultrasounds or other Referrals:  None Maternal Substance Abuse:  Yes:  Type: Marijuana Significant Maternal Medications:  None Significant Maternal Lab Results: Group B Strep negative  No results found for this or any previous visit (from the past 24 hour(s)).  Patient Active Problem List   Diagnosis Date Noted  . Encounter for elective induction of labor 10/22/2018  . History of pre-eclampsia in prior pregnancy, currently pregnant 09/15/2018  . Drug use affecting pregnancy 04/27/2018  . Rubella non-immune status, antepartum 04/14/2018  . Pregnancy 04/12/2018  . High risk HPV infection 10/03/2015  . Supervision of pregnancy with other poor reproductive or obstetric history, unspecified trimester 09/09/2015  . Moderate episode of recurrent major  depressive disorder ([place]) 09/13/2014  . Genital herpes simplex type 2 02/12/2014  . Bacterial vaginosis 10/16/2013  . Obesity 05/21/2011    Assessment/Plan:  Krystel Fletchall is a 28 y.o. [id number] at 14w0dhere for elective IOL.   Hx HSV on prophylaxis no signs of an outbreak. Hx of shoulder dystocia with last pregnancy. Hx of THC use in pregnancy., will repeat UDS today.  #Labor: Start pitocin at 246mmin after pt eats. Cervical checks as indicated. #Pain: Epidural as desired #FWB: 6-7 lbs by [facility]. Cat  I strip reassuring, no indications for interventions at this time. #ID:  GBS negative #MOF: breast first, bottle as needed #MOC:IUD (outpatient) #Circ:  outpatient  Jones Broom, Medical Student  10/22/2018, 9:02 AM   I confirm that I have verified the information documented in the medical student's note and that I have also personally reperformed the history, physical exam and all medical decision making activities of this service and have verified that all service and findings are accurately documented in this student's note.   Wende Mott, [place] 10/22/2018 10:16 AM

## 2018-10-22 NOTE — Progress Notes (Signed)
LABOR PROGRESS NOTE  Kara Sanchez is a 28 y.o. [id number] at [redacted]w[redacted]d  admitted for elective IOL.  Subjective: Patient is lying in bed, appears somewhat uncomfortable. Pain is getting more uncomfortable, although fentanyl improved pain a little bit.   Objective: BP (!) 107/57   Pulse 97   Temp 98 F (36.7 C) (Oral)   Resp 16   Ht 5\' 8"  (1.727 m)   Wt 116.3 kg   LMP 02/02/2018 (Exact Date)   BMI 38.97 kg/m  or  Vitals:   10/22/18 1609 10/22/18 1642 10/22/18 1648 10/22/18 1715  BP: 106/64  113/61 (!) 107/57  Pulse: 98  99 97  Resp: 18 16  16   Temp: 98 F (36.7 C)     TempSrc: Oral     Weight:      Height:        Dilation: 3 Effacement (%): 70 Cervical Position: Posterior Station: -2 Presentation: Vertex Exam by:: [place], rn FHT: baseline rate 120, moderate varibility, 15x15 acel, no decel Toco: irregular   Labs: Lab Results  Component Value Date   WBC 7.0 10/22/2018   HGB 11.3 (L) 10/22/2018   HCT 34.7 (L) 10/22/2018   MCV 73.4 (L) 10/22/2018   PLT 282 10/22/2018    Patient Active Problem List   Diagnosis Date Noted  . Encounter for elective induction of labor 10/22/2018  . History of pre-eclampsia in prior pregnancy, currently pregnant 09/15/2018  . Drug use affecting pregnancy 04/27/2018  . Rubella non-immune status, antepartum 04/14/2018  . Pregnancy 04/12/2018  . High risk HPV infection 10/03/2015  . Supervision of pregnancy with other poor reproductive or obstetric history, unspecified trimester 09/09/2015  . Moderate episode of recurrent major depressive disorder ([place]) 09/13/2014  . Genital herpes simplex type 2 02/12/2014  . Bacterial vaginosis 10/16/2013  . Obesity 05/21/2011    Assessment / Plan: 28 y.o. [id number] at [redacted]w[redacted]d here for elective IOL  Labor: Pitocin now at 16 mU/min. Pt interested in AROM, attempted but cervix too posterior to successfully rupture. Can consider repeat attempt once epidural is started. Fetal Wellbeing:  Cat I tracing,  reassuring with no intervention indicated at this time. Pain Control:  Fentanyl provided; epidural as desired Anticipated MOD:  Vaginal  Vanessa Kick [facility] Medical Student 10/22/2018, 5:38 PM

## 2018-10-22 NOTE — Progress Notes (Signed)
Patient ID: Kara Sanchez, female   DOB: 1990-10-14, 28 y.o.   MRN: [id number] Getting ready for epidural  Vitals:   10/22/18 2020 10/22/18 2025 10/22/18 2030 10/22/18 2035  BP: 107/63 (!) 125/45 120/60 (!) 124/42  Pulse: (!) 115 99 (!) 112 (!) 104  Resp: 16 16 18 18   Temp:      TempSrc:      SpO2: 100% 100% 100% 100%  Weight:      Height:       FHR reactive UCs every 2 min  Dilation: 3 Effacement (%): 70 Cervical Position: Posterior Station: -2 Presentation: Vertex Exam by:: Maryruth Hancock, cnm  Will continue plan of care Anticipate SVD

## 2018-10-22 NOTE — Anesthesia Preprocedure Evaluation (Signed)
Anesthesia Evaluation  Patient identified by MRN, date of birth, ID band Patient awake    Reviewed: Allergy & Precautions, NPO status , Patient's Chart, lab work & pertinent test results  Airway Mallampati: II  TM Distance: >3 FB Neck ROM: Full    Dental no notable dental hx.    Pulmonary neg pulmonary ROS, former smoker,    Pulmonary exam normal breath sounds clear to auscultation       Cardiovascular hypertension, negative cardio ROS Normal cardiovascular exam Rhythm:Regular Rate:Normal     Neuro/Psych negative neurological ROS  negative psych ROS   GI/Hepatic negative GI ROS, Neg liver ROS,   Endo/Other  negative endocrine ROS  Renal/GU negative Renal ROS  negative genitourinary   Musculoskeletal negative musculoskeletal ROS (+)   Abdominal (+) + obese,   Peds negative pediatric ROS (+)  Hematology negative hematology ROS (+)   Anesthesia Other Findings   Reproductive/Obstetrics (+) Pregnancy                             Anesthesia Physical Anesthesia Plan  ASA: II  Anesthesia Plan: Epidural   Post-op Pain Management:    Induction:   PONV Risk Score and Plan:   Airway Management Planned:   Additional Equipment:   Intra-op Plan:   Post-operative Plan:   Informed Consent:   Plan Discussed with:   Anesthesia Plan Comments:         Anesthesia Quick Evaluation  

## 2018-10-22 NOTE — Progress Notes (Signed)
LABOR PROGRESS NOTE  Kara Sanchez is a 29 y.o. E0P2330 at [redacted]w[redacted]d  admitted for elective IOL  Subjective: Patient is lying in bed, appears uncomfortable. She states her pain is still manageable. No other complaints currently.  Objective: BP (!) 120/56   Pulse 98   Temp 98 F (36.7 C) (Oral)   Resp 16   Ht 5\' 8"  (1.727 m)   Wt 116.3 kg   LMP 02/02/2018 (Exact Date)   BMI 38.97 kg/m  or  Vitals:   10/22/18 1757 10/22/18 1758 10/22/18 1837 10/22/18 1839  BP: (!) 121/41   (!) 120/56  Pulse: 97   98  Resp:  16 16   Temp:      TempSrc:      Weight:      Height:        Dilation: 3 Effacement (%): 70 Cervical Position: Posterior Station: -2 Presentation: Vertex Exam by:: Maryruth Hancock, cnm FHT: baseline rate 120-125, moderate varibility, 15x15acel, no decel, cat I Toco: 3 min  Labs: Lab Results  Component Value Date   WBC 7.0 10/22/2018   HGB 11.3 (L) 10/22/2018   HCT 34.7 (L) 10/22/2018   MCV 73.4 (L) 10/22/2018   PLT 282 10/22/2018    Patient Active Problem List   Diagnosis Date Noted  . Encounter for elective induction of labor 10/22/2018  . History of pre-eclampsia in prior pregnancy, currently pregnant 09/15/2018  . Drug use affecting pregnancy 04/27/2018  . Rubella non-immune status, antepartum 04/14/2018  . Pregnancy 04/12/2018  . High risk HPV infection 10/03/2015  . Supervision of pregnancy with other poor reproductive or obstetric history, unspecified trimester 09/09/2015  . Moderate episode of recurrent major depressive disorder ([place]) 09/13/2014  . Genital herpes simplex type 2 02/12/2014  . Bacterial vaginosis 10/16/2013  . Obesity 05/21/2011    Assessment / Plan: 28 y.o. Q7M2263 at [redacted]w[redacted]d here for elective IOL  Labor: Pitocin 18 mU/min. Can consider repeat AROM attempt once epidural is started. Cervical checks as indicated. Fetal Wellbeing:  Cat I tracing, reassuring with no intervention indicated at this time Pain Control:  Fentanyl provided; epidural as  desired Anticipated MOD:  vaginal  [place] Student 10/22/2018, 6:52 PM

## 2018-10-22 NOTE — Progress Notes (Signed)
Patient ID: Kara Sanchez, female   DOB: 1991/02/09, 28 y.o.   MRN: [id number] Feeling more pain and pressure Wants AROM  Vitals:   10/22/18 2055 10/22/18 2100 10/22/18 2105 10/22/18 2130  BP: (!) 83/51 (!) 105/58 (!) 114/53 (!) 126/44  Pulse: 97 (!) 102 96 99  Resp: 16 16 16 18   Temp:      TempSrc:      SpO2: 99% 100%    Weight:      Height:       FHR reassuring, category I UCs every 2 min  Dilation: 5 Effacement (%): 90 Cervical Position: Middle Station: -2 Presentation: Vertex Exam by:: Carmelia Roller, CNM  AROM small amount of clear fluid  Will redose epidural

## 2018-10-23 ENCOUNTER — Encounter (HOSPITAL_COMMUNITY): Payer: Self-pay | Admitting: *Deleted

## 2018-10-23 DIAGNOSIS — Z3A39 39 weeks gestation of pregnancy: Secondary | ICD-10-CM

## 2018-10-23 LAB — RPR: RPR Ser Ql: NONREACTIVE

## 2018-10-23 MED ORDER — TETANUS-DIPHTH-ACELL PERTUSSIS 5-2.5-18.5 LF-MCG/0.5 IM SUSP
0.5000 mL | Freq: Once | INTRAMUSCULAR | Status: AC
  Administered 2018-10-25: 09:00:00 0.5 mL via INTRAMUSCULAR
  Filled 2018-10-23 (×2): qty 0.5

## 2018-10-23 MED ORDER — ZOLPIDEM TARTRATE 5 MG PO TABS: 5.0000 mg | ORAL_TABLET | Freq: Every evening | ORAL | Status: DC | PRN

## 2018-10-23 MED ORDER — COCONUT OIL OIL
1.0000 "application " | TOPICAL_OIL | Status: DC | PRN
  Administered 2018-10-25: 1 via TOPICAL

## 2018-10-23 MED ORDER — WITCH HAZEL-GLYCERIN EX PADS: 1.0000 "application " | MEDICATED_PAD | CUTANEOUS | Status: DC | PRN

## 2018-10-23 MED ORDER — DIPHENHYDRAMINE HCL 25 MG PO CAPS: 25.0000 mg | ORAL_CAPSULE | Freq: Four times a day (QID) | ORAL | Status: DC | PRN

## 2018-10-23 MED ORDER — PRENATAL MULTIVITAMIN CH
1.0000 | ORAL_TABLET | Freq: Every day | ORAL | Status: DC
  Administered 2018-10-23 – 2018-10-25 (×3): 1 via ORAL
  Filled 2018-10-23 (×3): qty 1

## 2018-10-23 MED ORDER — ACETAMINOPHEN 325 MG PO TABS
650.0000 mg | ORAL_TABLET | ORAL | Status: DC | PRN
  Administered 2018-10-23 – 2018-10-24 (×6): 650 mg via ORAL
  Filled 2018-10-23 (×6): qty 2

## 2018-10-23 MED ORDER — SENNOSIDES-DOCUSATE SODIUM 8.6-50 MG PO TABS
2.0000 | ORAL_TABLET | ORAL | Status: DC
  Administered 2018-10-23 – 2018-10-25 (×2): 2 via ORAL
  Filled 2018-10-23 (×2): qty 2

## 2018-10-23 MED ORDER — ONDANSETRON HCL 4 MG PO TABS: 4.0000 mg | ORAL_TABLET | ORAL | Status: DC | PRN

## 2018-10-23 MED ORDER — ONDANSETRON HCL 4 MG/2ML IJ SOLN: 4.0000 mg | INTRAMUSCULAR | Status: DC | PRN

## 2018-10-23 MED ORDER — IBUPROFEN 600 MG PO TABS
600.0000 mg | ORAL_TABLET | Freq: Four times a day (QID) | ORAL | Status: DC
  Administered 2018-10-23 – 2018-10-25 (×10): 600 mg via ORAL
  Filled 2018-10-23 (×10): qty 1

## 2018-10-23 MED ORDER — SIMETHICONE 80 MG PO CHEW: 80.0000 mg | CHEWABLE_TABLET | ORAL | Status: DC | PRN

## 2018-10-23 MED ORDER — DIBUCAINE (PERIANAL) 1 % EX OINT: 1.0000 "application " | TOPICAL_OINTMENT | CUTANEOUS | Status: DC | PRN

## 2018-10-23 MED ORDER — BENZOCAINE-MENTHOL 20-0.5 % EX AERO
1.0000 "application " | INHALATION_SPRAY | CUTANEOUS | Status: DC | PRN
  Administered 2018-10-23: 1 via TOPICAL
  Filled 2018-10-23: qty 56

## 2018-10-23 NOTE — Progress Notes (Signed)
When queried about patient's low bp's (patient has had recurrent of high pressures during pregnancy), L&D nurse said that she thought it might be the wrong cuff size, but "they" were out of that cuff. Attempting to locate another cuff to check bp.

## 2018-10-23 NOTE — Lactation Note (Signed)
This note was copied from a baby's chart. Lactation Consultation Note  Patient Name: Kara Sanchez EUMPN'T Date: 10/23/2018 Reason for consult: Initial assessment;Term  LC in to visit with P5 Mom of term baby at 76 hrs old.  Mom choosing to breast and formula feed baby.  Mom doesn't see any colostrum when she hand expresses, so she feels that baby is not getting anything.  Reviewed breast massage and hand expression.  Unable to express any colostrum currently.  Encouraged STS, baby unwrapped and undressed and placed on Mom's chest.  Baby immediately started cueing.  Baby latched by Mom in football hold.  Added pillow support under baby.  Took baby off, and nipple pinched.  Had Mom support her breast, and control baby's latch by holding his head.  Baby able to latch deeper onto breast.  Swallows identified and Mom feeling uterine cramping.   Encouraged STS and feeding baby often with cues. Lactation brochure left with Mom.  Mom aware of IP and OP lactation support available to her.  Encouraged to call prn for assistance.   Maternal Data Formula Feeding for Exclusion: Yes Reason for exclusion: Mother's choice to formula and breast feed on admission Has patient been taught Hand Expression?: Yes Does the patient have breastfeeding experience prior to this delivery?: Yes  Feeding Feeding Type: Breast Fed Nipple Type: Slow - flow  LATCH Score Latch: Grasps breast easily, tongue down, lips flanged, rhythmical sucking.  Audible Swallowing: Spontaneous and intermittent  Type of Nipple: Everted at rest and after stimulation  Comfort (Breast/Nipple): Soft / non-tender  Hold (Positioning): Assistance needed to correctly position infant at breast and maintain latch.  LATCH Score: 9  Interventions Interventions: Breast feeding basics reviewed;Assisted with latch;Skin to skin;Breast massage;Hand express;Pre-pump if needed;Breast compression;Adjust position;Support pillows;Position  options;Hand pump  Lactation Tools Discussed/Used Tools: Bottle;Pump Breast pump type: Manual   Consult Status Consult Status: Follow-up Date: 10/24/18 Follow-up type: [place] 10/23/2018, 4:56 PM

## 2018-10-23 NOTE — Anesthesia Postprocedure Evaluation (Signed)
Anesthesia Post Note  Patient: Kara Sanchez  Procedure(s) Performed: AN AD HOC LABOR EPIDURAL     Patient location during evaluation: Mother Baby Anesthesia Type: Epidural Level of consciousness: awake and alert Pain management: pain level controlled Vital Signs Assessment: post-procedure vital signs reviewed and stable Respiratory status: spontaneous breathing, nonlabored ventilation and respiratory function stable Cardiovascular status: stable Postop Assessment: no headache, no backache and epidural receding Anesthetic complications: no    Last Vitals:  Vitals:   10/23/18 0231 10/23/18 0330  BP: (!) 81/58 138/83  Pulse: (!) 109 98  Resp: 18 18  Temp: 36.8 C 36.9 C  SpO2: 100% 100%    Last Pain:  Vitals:   10/23/18 0608  TempSrc:   PainSc: 4    Pain Goal:                   Rayvon Char

## 2018-10-23 NOTE — Clinical Social Work Maternal (Signed)
CLINICAL SOCIAL WORK MATERNAL/CHILD NOTE  Patient Details  Name: Kara Sanchez MRN: [id number] Date of Birth: 10/06/1990  Date:  10/23/2018  Clinical Social Worker Initiating Note:  Kara Sanchez Date/Time: Initiated:  10/23/18/1602     Child's Name:  Kara Sanchez Jr.   Biological Parents:  Mother, Father(Kara Sanchez and Kara Sanchez DOB: 07/08/1983)   Need for Interpreter:  None   Reason for Referral:  Behavioral Health Concerns, Current Substance Use/Substance Use During Pregnancy    Address:  [address] [place] [address]    Phone number:  [phone] (home)     Additional phone number:   Household Members/Support Persons (HM/SP):   Household Member/Support Person 1, Household Member/Support Person 2, Household Member/Support Person 3, Household Member/Support Person 4, Household Member/Support Person 5   HM/SP Name Relationship DOB or Age  HM/SP -1 Kara Sanchez FOB 07/12/1983  HM/SP -2 Kara Sanchez Son 12/06/2015  HM/SP -3 Kara Sanchez Son (lives with Kara Sanchez - paternal aunt) 08/07/2007  HM/SP -4 Kara Sanchez Son (lives with Kara Sanchez - paternal aunt) 11/09/2010  HM/SP -5 Kara Sanchez Son (lives with Kara Sanchez - paternal aunt) 11/12/2012  HM/SP -6        HM/SP -7        HM/SP -8          Natural Supports (not living in the home):  Extended Family, Immediate Family   Professional Supports: None   Employment: Unemployed   Type of Work:     Education:  High school graduate   Homebound arranged:    Financial Resources:  Medicaid   Other Resources:  Food Stamps , WIC   Cultural/Religious Considerations Which May Impact Care:    Strengths:  Ability to meet basic needs , Home prepared for child , Pediatrician chosen   Psychotropic Medications:         Pediatrician:    [place] area  Pediatrician List:   [place]    [place] Other([place] [facility])  [place]    [place]     [place]    [place]      Pediatrician Fax Number:    Risk Factors/Current Problems:      Cognitive State:  Able to Concentrate , Alert , Linear Thinking , Insightful    Mood/Affect:  Bright , Calm , Comfortable , Interested , Relaxed    CSW Assessment:  CSW received consult for history of depression and THC use.  CSW met with MOB to offer support and complete assessment.    MOB resting in bed holding infant, when CSW entered the room. CSW introduced self and explained reason for consult to which MOB was understanding. MOB welcoming of CSW visit and was pleasant and engaged throughout assessment. MOB appeared well-bonded to infant and was appropriate with and attentive to infant during visit. MOB reported she currently lives with FOB and her 2-year-old son in [place]. MOB shared that she has 3 other children who are not currently in her custody. MOB disclosed that CPS was involved in placement but that her rights were not terminated, she relinquished them. MOB shared that her two oldest children currently live with their paternal aunt who has full custody, Kara Sanchez, in [place] and that her 5-year-old currently lives with his paternal aunt who has full custody, Kara Sanchez, in [place]. MOB unable to provide contact information as she communicates with them both through Facebook. MOB reported they do weekly video chats and   that MOB visits with them often. MOB stated she is not currently employed but confirmed she receives both WIC and food stamps and is aware of process to get infant added on to her plan.   CSW inquired about MOB's mental health history and MOB acknowledged being diagnosed with depression and anxiety in high school. MOB denied any recent symptoms and denied being on any current medications. MOB did report she has participated in counseling through [facility], last time being in 2019 for a few weeks. MOB  reported it was helpful at the time. CSW inquired about if MOB experienced any PPD with her other children and MOB shared she experienced some with her oldest. MOB shared symptoms of SI after the birth of her first child but denied anything since. CSW provided education regarding the baby blues period vs. perinatal mood disorders, discussed treatment and gave resources for mental health follow up if concerns arise.  CSW recommends self-evaluation during the postpartum time period using the New Mom Checklist from Postpartum Progress and encouraged MOB to contact a medical professional if symptoms are noted at any time. MOB denied any current SI, HI or DV and reported having good support from FOB, her cousin, her sisters and her dad. MOB did not appear to be displaying any acute mental health symptoms and seemed in good-spirits.  CSW inquired about MOB's substance use history and MOB openly disclosed THC use to help with her pregnancy symptoms but reported last use was in the beginning of July. CSW informed MOB of Hospital Drug Policy and explained UDS came back negative but that CDS was still pending and that a CPS report would be made, if warranted. MOB expressed understanding and denied any questions or concerns regarding policy. MOB reported last CPS case was open in 2015 but has since been closed. CSW spoke with [place] CPS who confirmed MOB does not have any current cases open. CSW inquired about MOB's interest in being referred for programs that could offer additional support once home and MOB was receptive. CSW to make CC4C and Healthy Start referrals.  MOB confirmed having all essential items for infant once discharged and stated infant would be sleeping in a pack 'n' play once home. CSW provided review of Sudden Infant Death Syndrome (SIDS) precautions and safe sleeping habits.  CSW will continue to monitor CDS and make report, if necessary.  CSW Plan/Description:  No Further Intervention  Required/No Barriers to Discharge, Sudden Infant Death Syndrome (SIDS) Education, Perinatal Mood and Anxiety Disorder (PMADs) Education, Hospital Drug Screen Policy Information, Other Information/Referral to Community Resources, CSW Will Continue to Monitor Umbilical Cord Tissue Drug Screen Results and Make Report if Warranted    Aldine Chakraborty, LCSWA 10/23/2018, 4:47 PM  

## 2018-10-23 NOTE — Discharge Summary (Addendum)
Postpartum Discharge Summary     Patient Name: Kara Sanchez DOB: 25-Apr-1990 MRN: [id number]  Date of admission: 10/22/2018 Delivering Provider: Seabron Spates   Date of discharge: 10/25/2018  Admitting diagnosis: pregnancy Intrauterine pregnancy: [redacted]w[redacted]d    Secondary diagnosis:  Active Problems:   Encounter for elective induction of labor  Additional problems: none     Discharge diagnosis: Term Pregnancy Delivered                                                                                                Post partum procedures:MMR vaccination , Depo injection   Augmentation: AROM and Pitocin  Complications: None  Hospital course:  Induction of Labor With Vaginal Delivery   28y.o. yo [id number] admitted to the hospital 10/22/2018 for induction of labor.  Indication for induction: Elective.  Patient had an uncomplicated labor course as follows: Came in for elective IOL due to being tired of being pregnant Pitocin started and contractions began Pt requested AROM.  It was done about 1.5 hours prior to birth. (went from 5cm to Delivery in just over 1 hr) Membrane Rupture Time/Date: 11:00 PM ,10/22/2018   Intrapartum Procedures: Episiotomy: None [1]                                         Lacerations:  None [1]  Patient had delivery of a Viable infant.  Information for the patient's newborn[facility]  [id number]Delivery Method: Vag-Spont    10/23/2018  Details of delivery can be found in separate delivery note.  Patient had a routine postpartum course. Patient is discharged home 10/25/18.  Magnesium Sulfate recieved: No BMZ received: No  Physical exam  Vitals:   10/23/18 2100 10/24/18 0515 10/24/18 2226 10/25/18 0530  BP: 114/72 111/70 113/64 (!) 113/52  Pulse: 87 78 85 71  Resp: 18 18 18 17   Temp: 98 F (36.7 C) 98.1 F (36.7 C) 97.8 F (36.6 C) 98.4 F (36.9 C)  TempSrc: Oral Oral Oral Oral  SpO2: 99% 98% 99% 99%  Weight:      Height:        General: alert, cooperative and no distress Lochia: appropriate Uterine Fundus: firm Incision: N/A DVT Evaluation: No evidence of DVT seen on physical exam. Calf/Ankle edema is present Labs: Lab Results  Component Value Date   WBC 7.0 10/22/2018   HGB 11.3 (L) 10/22/2018   HCT 34.7 (L) 10/22/2018   MCV 73.4 (L) 10/22/2018   PLT 282 10/22/2018   CMP Latest Ref Rng & Units 09/14/2018  Glucose 70 - 99 mg/dL 91  BUN 6 - 20 mg/dL <5(L)  Creatinine 0.44 - 1.00 mg/dL 0.38(L)  Sodium 135 - 145 mmol/L 138  Potassium 3.5 - 5.1 mmol/L 3.3(L)  Chloride 98 - 111 mmol/L 109  CO2 22 - 32 mmol/L 19(L)  Calcium 8.9 - 10.3 mg/dL 8.4(L)  Total Protein 6.5 - 8.1 g/dL 5.9(L)  Total Bilirubin 0.3 - 1.2 mg/dL 0.2(L)  Alkaline Phos 38 - 126 U/L 90  AST 15 - 41 U/L 14(L)  ALT 0 - 44 U/L 13    Discharge instruction: per After Visit Summary and "Baby and Me Booklet".  After visit meds:  Allergies as of 10/25/2018   No Known Allergies     Medication List    STOP taking these medications   aspirin EC 81 MG tablet     TAKE these medications   AMBULATORY NON FORMULARY MEDICATION 1 Device by Other route once a week. Blood pressure cuff/ Medium  Monitored regularly at home/ ICD 10: HROB O09.09   HYDROcodone-acetaminophen 5-325 MG tablet Commonly known as: NORCO/VICODIN Take 1-2 tablets by mouth every 6 (six) hours as needed for moderate pain or severe pain.   ibuprofen 600 MG tablet Commonly known as: ADVIL Take 1 tablet (600 mg total) by mouth every 6 (six) hours as needed.   Prenatal 1 30-0.975-200 MG Caps Take by mouth.   PRENATAL VITAMINS PO Take by mouth.   valACYclovir 500 MG tablet Commonly known as: VALTREX Take 500 mg by mouth 2 (two) times daily.   valACYclovir 500 MG tablet Commonly known as: VALTREX Take by mouth.       Diet: routine diet  Activity: Advance as tolerated. Pelvic rest for 6 weeks.   Outpatient follow up:4 weeks at [facility] office (message  sent) Follow up Appt: Future Appointments  Date Time Provider [place]  11/24/2018 10:30 AM Truett Mainland, DO [facility] None   Follow up Visit: [place]. Schedule an appointment as soon as possible for a visit in 4 week(s).   Specialty: Obstetrics and Gynecology Why: Make appointment to be seen in 4 weeks for postpartum care Contact information: [address] [place] [address] [phone]          HP Please schedule this patient for Postpartum visit in: 4 weeks with the following provider: Any provider For C/S patients schedule nurse incision check in weeks 2 weeks: no Low risk pregnancy complicated by: none Delivery mode:  SVD Anticipated Birth Control:  IUD PP Procedures needed: none  Schedule Integrated BH visit: no   Newborn Data: Live born female  Birth Weight:  3810g APGAR: 7 , 8  Newborn Delivery   Birth date/time: 10/23/2018 00:11:00 Delivery type: Vaginal, Spontaneous      Baby Feeding: Breast Disposition:rooming in   10/25/2018 Lajean Manes, CNM

## 2018-10-24 ENCOUNTER — Other Ambulatory Visit: Payer: Self-pay

## 2018-10-24 MED ORDER — HYDROCODONE-ACETAMINOPHEN 5-325 MG PO TABS
1.0000 | ORAL_TABLET | Freq: Four times a day (QID) | ORAL | Status: DC | PRN
  Administered 2018-10-25 (×3): 2 via ORAL
  Filled 2018-10-24 (×3): qty 2

## 2018-10-24 MED ORDER — MEASLES, MUMPS & RUBELLA VAC IJ SOLR
0.5000 mL | Freq: Once | INTRAMUSCULAR | Status: AC
  Administered 2018-10-25: 0.5 mL via SUBCUTANEOUS
  Filled 2018-10-24: qty 0.5

## 2018-10-24 NOTE — Progress Notes (Signed)
Post Partum Day 1 Subjective: Kara Sanchez  is a 28 y.o. [id number] s/p SVD at [redacted]w[redacted]d.  She reports she is doing well. No acute events overnight. She denies any problems with ambulating, voiding or po intake. Denies nausea or vomiting.  Pain is not well controlled on ibuprofen and Tylenol. She reports "extreme cramping while nursing."  Lochia is minimal and improving.  Objective: Blood pressure 111/70, pulse 78, temperature 98.1 F (36.7 C), temperature source Oral, resp. rate 18, height 5\' 8"  (1.727 m), weight 116.3 kg, last menstrual period 02/02/2018, SpO2 98 %, currently breastfeeding and supplementing with formula.  Physical Exam:  General: alert, cooperative and no distress Lochia: appropriate Uterine Fundus: firm Incision: n/a DVT Evaluation: No evidence of DVT seen on physical exam. Negative Homan's sign. No cords or calf tenderness. No significant calf/ankle edema.  Recent Labs    10/22/18 0855  HGB 11.3*  HCT 34.7*    Assessment/Plan: Plan for discharge tomorrow, Breastfeeding and Social Work consult   LOS: 2 days   Laury Deep, MSN, CNM 10/24/2018, 5:10 PM

## 2018-10-24 NOTE — Lactation Note (Signed)
This note was copied from a baby's chart. Lactation Consultation Note  Patient Name: Kara Sanchez [id number] Date: 10/24/2018   Mom is a P5. She is primarily formula feeding, but says she has not put this baby to the breast recently b/c of afterbirth cramps. Mom says once she gets some pain medicine & her cramps under control, she'd like to put infant to the breast. I explained to Mom how to call out for lactation assistance & Mom said she would call for [place].  Matthias Hughs [facility] 10/24/2018, 1:36 PM

## 2018-10-25 MED ORDER — IBUPROFEN 600 MG PO TABS: 600.0000 mg | ORAL_TABLET | Freq: Four times a day (QID) | ORAL | 0 refills | Status: DC | PRN

## 2018-10-25 MED ORDER — HYDROCODONE-ACETAMINOPHEN 5-325 MG PO TABS: 1.0000 | ORAL_TABLET | Freq: Four times a day (QID) | ORAL | 0 refills | Status: DC | PRN

## 2018-10-25 MED ORDER — MEDROXYPROGESTERONE ACETATE 150 MG/ML IM SUSP
150.0000 mg | Freq: Once | INTRAMUSCULAR | Status: AC
  Administered 2018-10-25: 14:00:00 150 mg via INTRAMUSCULAR
  Filled 2018-10-25: qty 1

## 2018-10-27 ENCOUNTER — Encounter: Payer: Medicaid Other | Admitting: Family Medicine

## 2018-10-27 ENCOUNTER — Ambulatory Visit: Payer: Self-pay

## 2018-10-27 NOTE — Lactation Note (Addendum)
This note was copied from a baby's chart. Lactation Consultation Note: Mother paged for [facility] assistance. She reports that her left breast began hurting last night. Mother reports that she got in the shower and let the warm water flow over her breast , then request that the nurse give her heating pads for her breast.  Mother reports that she attempt to pump last nigh and couldn't get any milk to flow. She offered the left breast to the infant yesterday and reports that she could tell that infant couldn't get milk out of her breast.   Mothers breast is filling. She doesn't have any reddened areas or hard knots on her breast. Mother reports pain when touching her breast.  Mother reports that pumping hurts her nipple. She was switched to a #27 flange's. Advised mother to begin to use ice instead of heat to reduce swelling every 3-4 hours. Mother taught to do good breast massage.  Advised mother to continue to offer infant her breast . She is now pumping and LC will be paged for infants next feeding to assist with latch.  Mother was given ice packs to use every 3-4 hours.   Mother advised to continue to cue base feed infant . Advised to follow up with LC as needed.   Patient Name: Boy Karin Pinedo [id number] Date: 10/27/2018 Reason for consult: Follow-up assessment   Maternal Data    Feeding Feeding Type: Bottle Fed - Formula  LATCH Score                   Interventions Interventions: Expressed milk;DEBP;Ice  Lactation Tools Discussed/Used     Consult Status Consult Status: Follow-up Date: 10/27/18 Follow-up type: In-patient    Jess Barters [facility] 10/27/2018, 10:03 AM

## 2018-11-24 ENCOUNTER — Ambulatory Visit: Payer: Medicaid Other | Admitting: Family Medicine

## 2019-01-12 ENCOUNTER — Other Ambulatory Visit: Payer: Self-pay

## 2019-01-12 ENCOUNTER — Encounter: Payer: Self-pay | Admitting: Family Medicine

## 2019-01-12 ENCOUNTER — Ambulatory Visit (INDEPENDENT_AMBULATORY_CARE_PROVIDER_SITE_OTHER): Payer: Medicaid Other | Admitting: Family Medicine

## 2019-01-12 DIAGNOSIS — Z30013 Encounter for initial prescription of injectable contraceptive: Secondary | ICD-10-CM | POA: Diagnosis not present

## 2019-01-12 MED ORDER — MEDROXYPROGESTERONE ACETATE 150 MG/ML IM SUSP
150.0000 mg | Freq: Once | INTRAMUSCULAR | Status: AC
  Administered 2019-01-12: 10:00:00 150 mg via INTRAMUSCULAR

## 2019-01-12 MED ORDER — MEDROXYPROGESTERONE ACETATE 150 MG/ML IM SUSP: 150.0000 mg | INTRAMUSCULAR | 3 refills | Status: DC

## 2019-01-12 NOTE — Progress Notes (Signed)
Subjective:     Kara Sanchez is a 28 y.o. female who presents for a postpartum visit. She is 7 weeks postpartum following a spontaneous vaginal delivery. I have fully reviewed the prenatal and intrapartum course. The delivery was at 62 gestational weeks. Outcome: spontaneous vaginal delivery. Anesthesia: epidural. Postpartum course has been normal. Baby's course has been normal. Baby is feeding by both breast and bottle - Similac Advance. Bleeding no bleeding. Bowel function is normal. Bladder function is normal. Patient is not sexually active. Contraception method is Depo-Provera injections. Postpartum depression screening: negative.  The following portions of the patient's history were reviewed and updated as appropriate: allergies, current medications, past family history, past medical history, past social history, past surgical history and problem list.  Review of Systems Pertinent items are noted in HPI.   Objective:    There were no vitals taken for this visit.  General:  alert, cooperative and no distress  Lungs: clear to auscultation bilaterally  Heart:  regular rate and rhythm, S1, S2 normal, no murmur, click, rub or gallop  Abdomen: soft, non-tender; bowel sounds normal; no masses,  no organomegaly        Assessment:     Normal postpartum exam. Pap smear done at today's visit.   Plan:    1. Contraception: Depo-Provera injections 2. Follow up in: 3 months or as needed.

## 2019-01-12 NOTE — Addendum Note (Signed)
Addended by: Wendelyn Breslow L on: 01/12/2019 10:01 AM   Modules accepted: Orders

## 2019-03-30 ENCOUNTER — Ambulatory Visit: Payer: Medicaid Other

## 2019-04-03 ENCOUNTER — Ambulatory Visit (INDEPENDENT_AMBULATORY_CARE_PROVIDER_SITE_OTHER): Payer: Medicaid Other

## 2019-04-03 ENCOUNTER — Other Ambulatory Visit: Payer: Self-pay

## 2019-04-03 VITALS — BP 104/63 | HR 89 | Ht 68.0 in | Wt 252.0 lb

## 2019-04-03 DIAGNOSIS — Z3042 Encounter for surveillance of injectable contraceptive: Secondary | ICD-10-CM | POA: Diagnosis not present

## 2019-04-03 MED ORDER — MEDROXYPROGESTERONE ACETATE 150 MG/ML IM SUSP
150.0000 mg | Freq: Once | INTRAMUSCULAR | Status: AC
  Administered 2019-04-03: 150 mg via INTRAMUSCULAR

## 2019-04-03 MED FILL — medroxyPROGESTERone ACETATE: 150 | 90 days supply | Qty: 1 | Fill #0

## 2019-04-03 NOTE — Progress Notes (Addendum)
Kara Sanchez here for Depo-Provera  Injection.  Injection administered without complication. Patient will return in 3 months for next injection.  Millicent Blazejewski l Tiauna Whisnant, CMA 04/03/2019  11:00 AM  Attestation of Attending Supervision of CMA/RN: Evaluation and management procedures were performed by the nurse under my supervision and collaboration.  I have reviewed the nursing note and chart, and I agree with the management and plan.  Carolyn L. Harraway-Smith, M.D., [place]

## 2019-06-26 ENCOUNTER — Other Ambulatory Visit: Payer: Self-pay

## 2019-06-26 ENCOUNTER — Ambulatory Visit (INDEPENDENT_AMBULATORY_CARE_PROVIDER_SITE_OTHER): Payer: Medicaid Other

## 2019-06-26 VITALS — BP 115/65 | HR 89 | Ht 68.0 in | Wt 262.0 lb

## 2019-06-26 DIAGNOSIS — Z3042 Encounter for surveillance of injectable contraceptive: Secondary | ICD-10-CM | POA: Diagnosis not present

## 2019-06-26 MED ORDER — MEDROXYPROGESTERONE ACETATE 150 MG/ML IM SUSP: 150.0000 mg | INTRAMUSCULAR | 0 refills | Status: DC

## 2019-06-26 MED ORDER — MEDROXYPROGESTERONE ACETATE 150 MG/ML IM SUSP
150.0000 mg | Freq: Once | INTRAMUSCULAR | Status: AC
  Administered 2019-06-26: 150 mg via INTRAMUSCULAR

## 2019-06-26 MED FILL — MEDROXYPROGESTERONE ACETATE: 150 | 90 days supply | Qty: 1 | Fill #0

## 2019-06-26 NOTE — Progress Notes (Signed)
Kara Sanchez here for Depo-Provera  Injection.  Injection administered without complication. Patient will return in 3 months for next injection.  Dewight Catino l Alianis Trimmer, CMA 06/26/2019  10:37 AM

## 2019-09-25 ENCOUNTER — Ambulatory Visit: Payer: Medicaid Other

## 2019-10-04 ENCOUNTER — Ambulatory Visit: Payer: Medicaid Other

## 2019-10-06 ENCOUNTER — Other Ambulatory Visit: Payer: Self-pay

## 2019-10-06 ENCOUNTER — Ambulatory Visit (INDEPENDENT_AMBULATORY_CARE_PROVIDER_SITE_OTHER): Payer: Medicaid Other

## 2019-10-06 VITALS — BP 107/62 | HR 90 | Wt 252.1 lb

## 2019-10-06 DIAGNOSIS — Z3202 Encounter for pregnancy test, result negative: Secondary | ICD-10-CM | POA: Diagnosis not present

## 2019-10-06 DIAGNOSIS — Z3042 Encounter for surveillance of injectable contraceptive: Secondary | ICD-10-CM

## 2019-10-06 LAB — POCT URINE PREGNANCY: Preg Test, Ur: NEGATIVE

## 2019-10-06 MED ORDER — MEDROXYPROGESTERONE ACETATE 150 MG/ML IM SUSP
150.0000 mg | Freq: Once | INTRAMUSCULAR | Status: AC
  Administered 2019-10-06: 150 mg via INTRAMUSCULAR

## 2019-10-06 MED FILL — MEDROXYPROGESTERONE ACETATE: 150 | 90 days supply | Qty: 1 | Fill #1

## 2019-10-06 NOTE — Progress Notes (Signed)
Patient was assessed and managed by nursing staff during this encounter. I have reviewed the chart and agree with the documentation and plan. I have also made any necessary editorial changes.  Teegan Guinther, MD 10/06/2019 11:38 AM 

## 2019-10-06 NOTE — Progress Notes (Signed)
Kara Sanchez here for Depo-Provera  Injection.  Injection administered without complication. Patient will return in 3 months for next injection.  Pt is 2 weeks late for injection. UPT negative. Pt made aware that she will need to schedule annual appt and Depo for next visit.  Larsen Dungan l Laurent Cargile, CMA 10/06/2019  10:28 AM

## 2019-12-27 ENCOUNTER — Ambulatory Visit: Payer: Medicaid Other | Admitting: Obstetrics & Gynecology

## 2020-01-02 ENCOUNTER — Ambulatory Visit (INDEPENDENT_AMBULATORY_CARE_PROVIDER_SITE_OTHER): Payer: Medicaid Other

## 2020-01-02 ENCOUNTER — Other Ambulatory Visit: Payer: Self-pay

## 2020-01-02 VITALS — BP 103/69 | HR 93 | Wt 253.0 lb

## 2020-01-02 DIAGNOSIS — Z3042 Encounter for surveillance of injectable contraceptive: Secondary | ICD-10-CM

## 2020-01-02 DIAGNOSIS — B009 Herpesviral infection, unspecified: Secondary | ICD-10-CM

## 2020-01-02 MED ORDER — MEDROXYPROGESTERONE ACETATE 150 MG/ML IM SUSP
150.0000 mg | Freq: Once | INTRAMUSCULAR | Status: AC
  Administered 2020-01-02: 150 mg via INTRAMUSCULAR

## 2020-01-02 MED ORDER — VALACYCLOVIR HCL 1 G PO TABS: ORAL_TABLET | ORAL | 10 refills | Status: DC

## 2020-01-02 NOTE — Progress Notes (Addendum)
Kara Sanchez here for Depo-Provera  Injection.  Injection administered without complication. Patient will return in 3 months for next injection.  Pt also requests a refill of Valtrex. Medication was sent to the pharmacy.  [name], CMA 01/02/2020  3:56 PM  Patient was assessed and managed by nursing staff during this encounter. I have reviewed the chart and agree with the documentation and plan. I have also made any necessary editorial changes.  Wynelle Bourgeois, CNM 01/03/2020 12:40 AM

## 2020-01-19 IMAGING — US US OB COMP LESS 14 WK
1 series · 14 of 28 positions shown · non-contrast
Comparison: None.

CLINICAL DATA: Cramps and diarrhea.

EXAM:
OBSTETRIC <14 WK ULTRASOUND
TECHNIQUE: Transabdominal ultrasound was performed for evaluation of the
gestation as well as the maternal uterus and adnexal regions.

[Series 1: us ob comp less 14 wk · 0.24mm/px · 14 of 49 slices shown]
[im 2/49]
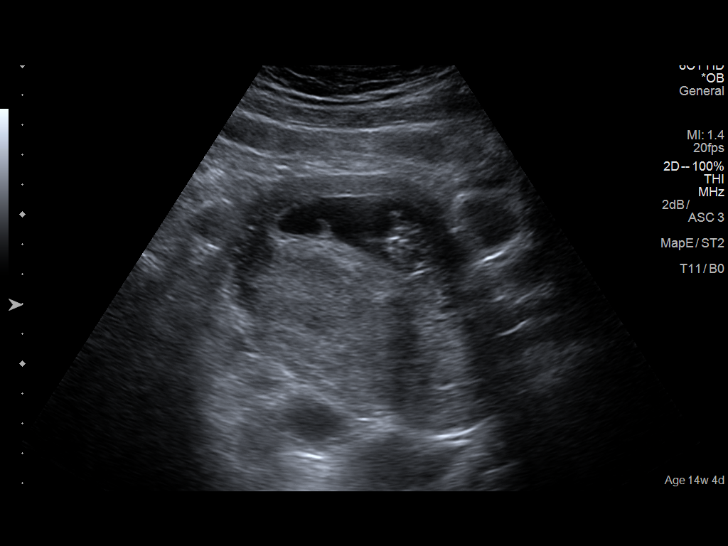
[im 6/49]
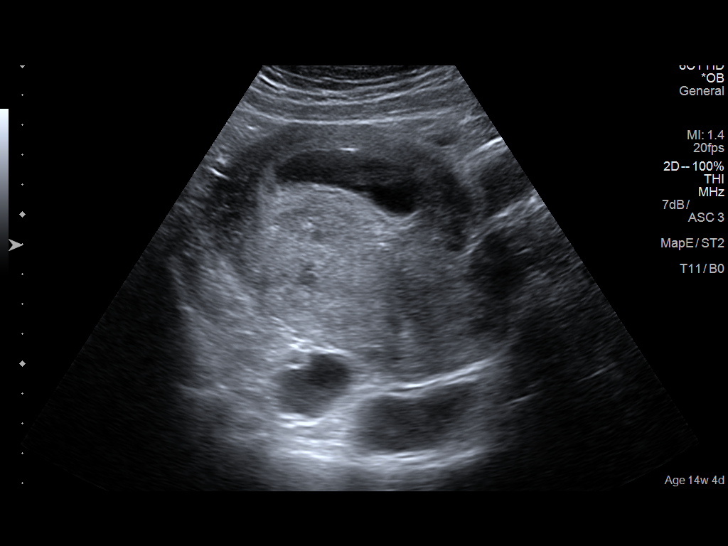
[im 9/49]
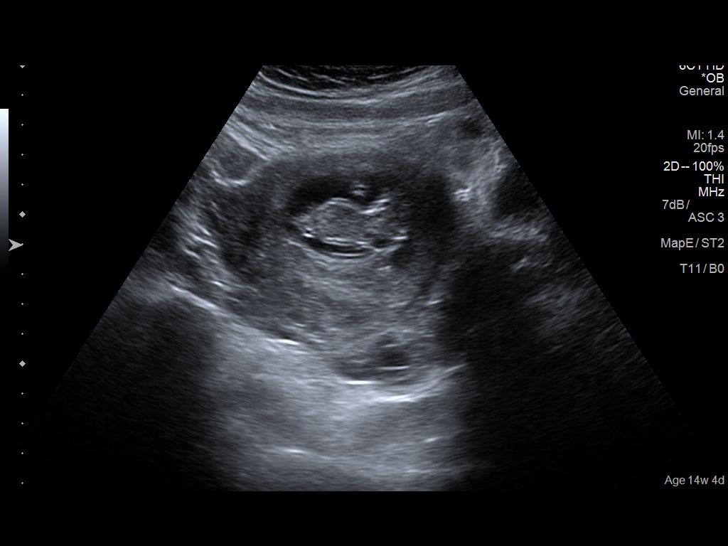
[im 13/49]
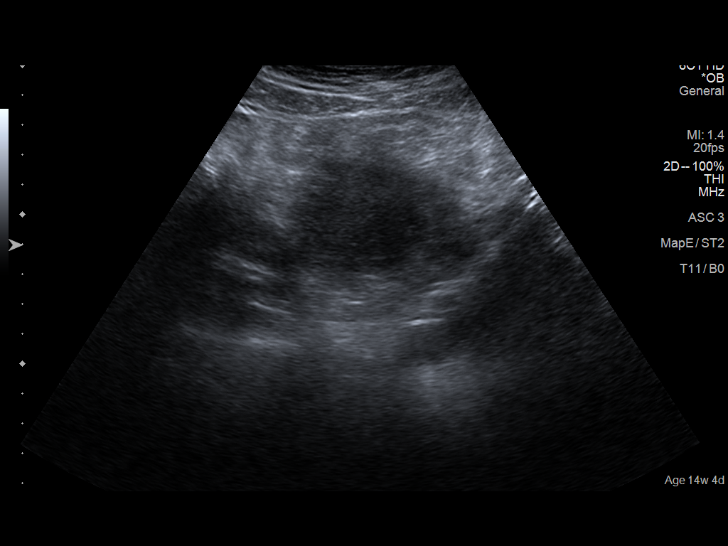
[im 17/49]
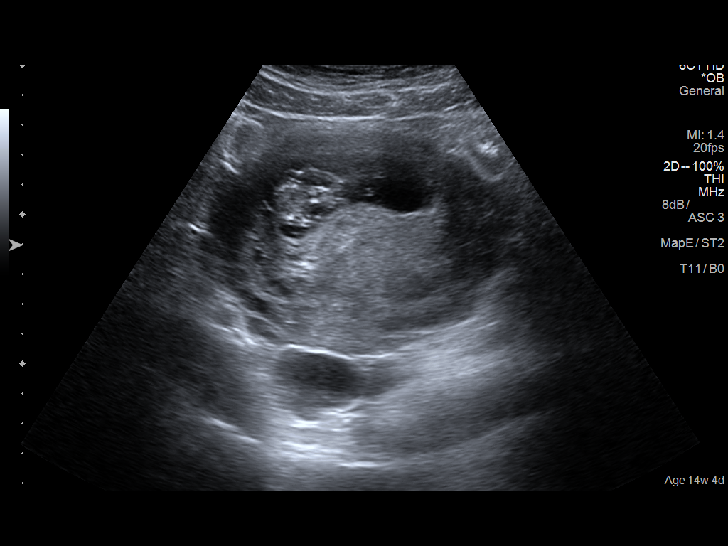
[im 20/49]
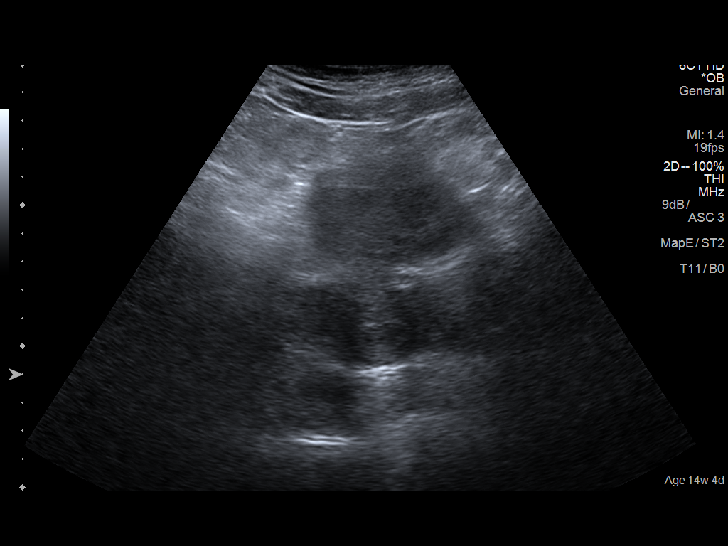
[im 24/49]
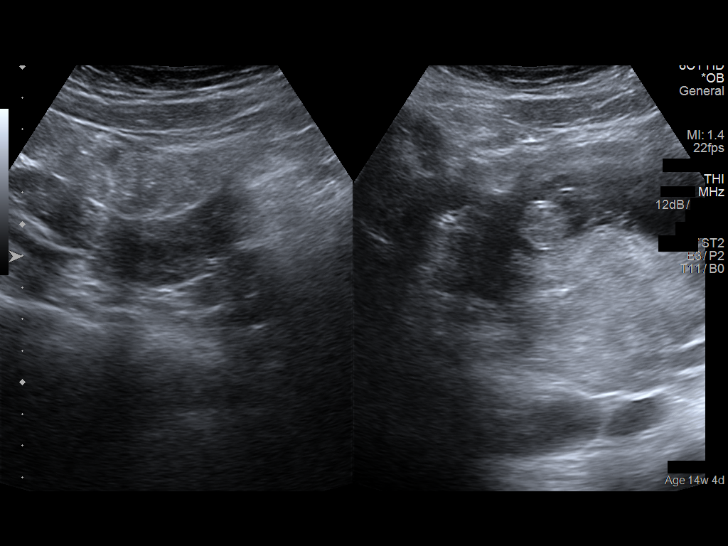
[im 27/49]
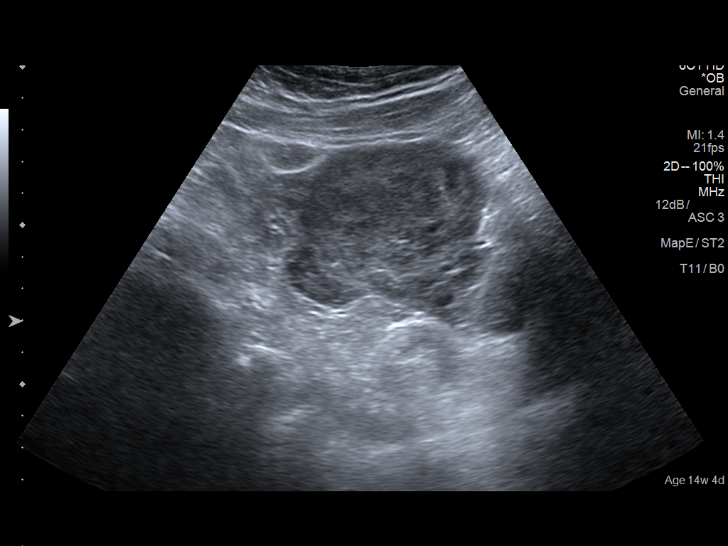
[im 31/49]
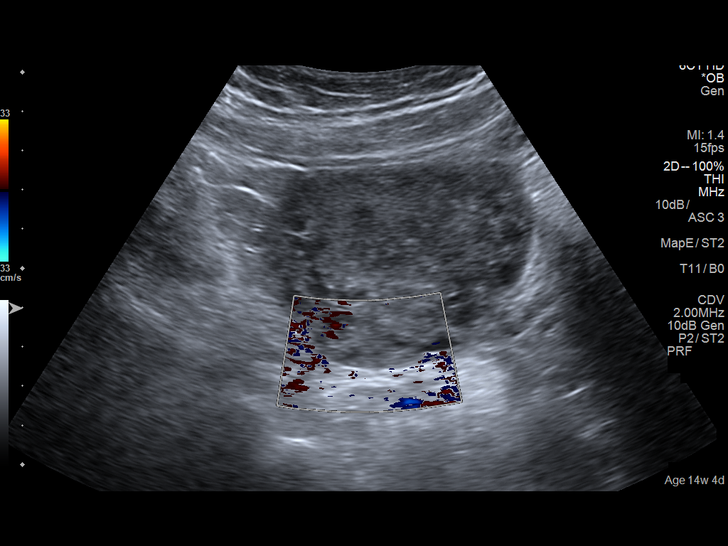
[im 34/49]
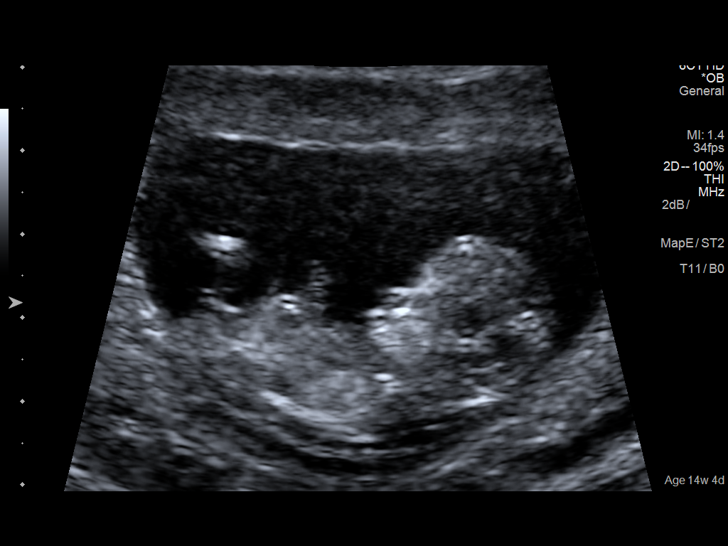
[im 38/49]
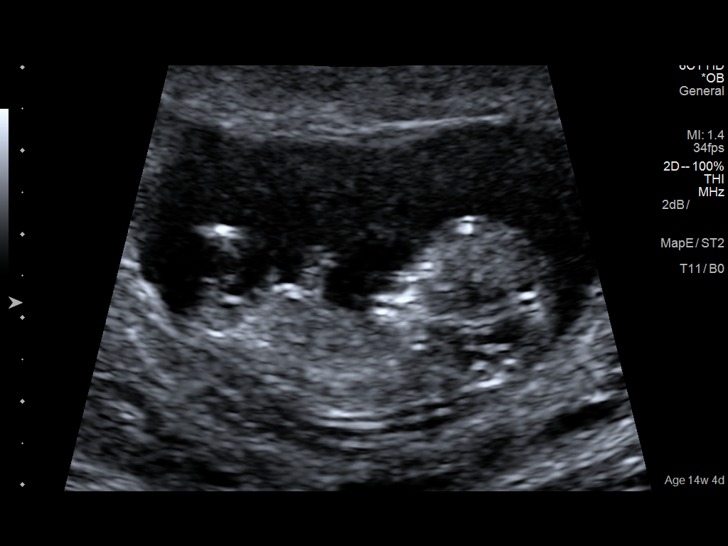
[im 41/49]
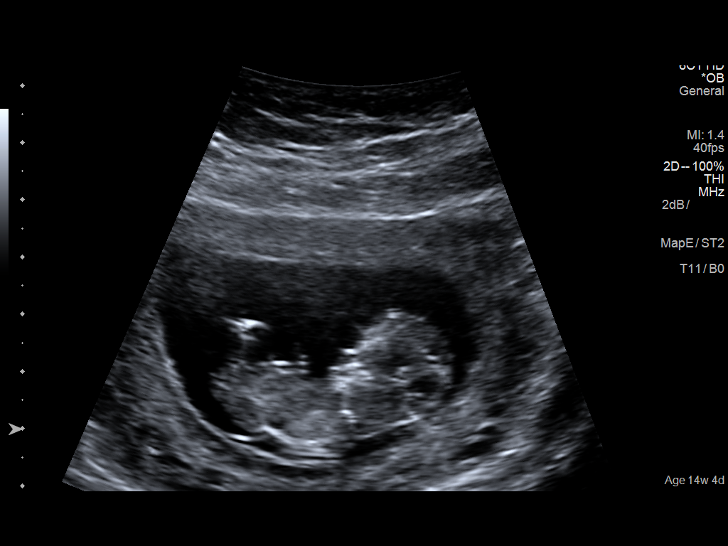
[im 45/49]
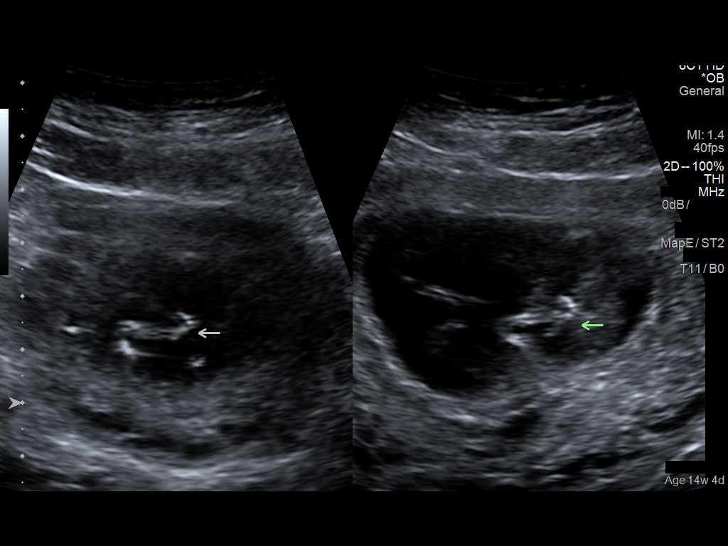
[im 49/49]
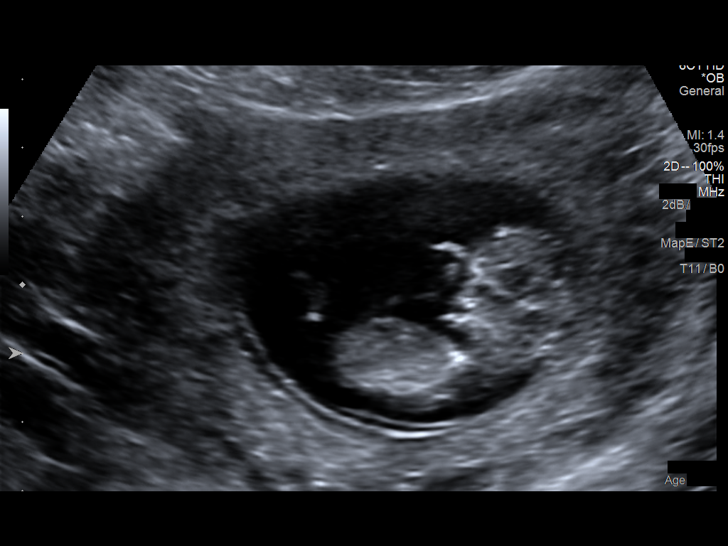

[14 of 28 positions shown; findings below may reference images not displayed]

FINDINGS: Intrauterine gestational sac: Single

Yolk sac:  Not visualized

Embryo:  Present

Cardiac Activity: Present

Heart Rate: 169 bpm

CRL:   45 mm   11 w 2 d                  US EDC: 10/29/2018

Subchorionic hemorrhage:  None visualized.

Maternal uterus/adnexae: Mild posterior myometrial thickening likely
reflecting a focal contraction. No mass or pelvic fluid is seen.
IMPRESSION: 1. Single living intrauterine pregnancy measuring 11 weeks 2 days.
2. No acute finding.

## 2020-03-25 ENCOUNTER — Ambulatory Visit: Payer: Medicaid Other

## 2020-03-29 ENCOUNTER — Other Ambulatory Visit: Payer: Self-pay

## 2020-03-29 ENCOUNTER — Ambulatory Visit (INDEPENDENT_AMBULATORY_CARE_PROVIDER_SITE_OTHER): Payer: Medicaid Other

## 2020-03-29 VITALS — BP 116/74 | HR 101 | Wt 244.0 lb

## 2020-03-29 DIAGNOSIS — B009 Herpesviral infection, unspecified: Secondary | ICD-10-CM

## 2020-03-29 DIAGNOSIS — Z3042 Encounter for surveillance of injectable contraceptive: Secondary | ICD-10-CM | POA: Diagnosis not present

## 2020-03-29 MED ORDER — MEDROXYPROGESTERONE ACETATE 150 MG/ML IM SUSP
150.0000 mg | Freq: Once | INTRAMUSCULAR | Status: AC
  Administered 2020-03-29: 150 mg via INTRAMUSCULAR

## 2020-03-29 MED ORDER — VALACYCLOVIR HCL 1 G PO TABS: ORAL_TABLET | ORAL | 10 refills | Status: DC

## 2020-03-29 MED FILL — valACYclovir HCL 1 GM TABS: 1 | 5 days supply | Qty: 10 | Fill #0

## 2020-03-29 NOTE — Progress Notes (Signed)
Kara Sanchez here for Depo-Provera  Injection.  Injection administered without complication. Patient will return in 3 months for next injection.  Kara Sanchez l Kara Sanchez, CMA 03/29/2020  9:25 AM   Attestation of Attending Supervision of CMA/RN: Evaluation and management procedures were performed by the nurse under my supervision and collaboration.  I have reviewed the nursing note and chart, and I agree with the management and plan.  Kara Sanchez, M.D., [place]

## 2020-04-23 ENCOUNTER — Ambulatory Visit: Payer: Medicaid Other | Admitting: Advanced Practice Midwife

## 2020-05-14 ENCOUNTER — Ambulatory Visit: Payer: Medicaid Other | Admitting: Advanced Practice Midwife

## 2020-06-17 ENCOUNTER — Ambulatory Visit: Payer: Medicaid Other

## 2020-06-20 ENCOUNTER — Ambulatory Visit: Payer: Medicaid Other | Admitting: Family Medicine

## 2020-06-28 ENCOUNTER — Ambulatory Visit: Payer: Medicaid Other

## 2020-06-28 NOTE — Progress Notes (Deleted)
Pt arrived for Depo Provera injection.  Pt tolerated injection well in *** gluteal. Last AEX - *** Last Depo Provera Given - 03/29/2020 Pt is within due dates. Pt should return between *** and ***

## 2020-07-05 ENCOUNTER — Ambulatory Visit: Payer: Medicaid Other | Admitting: Family Medicine

## 2020-07-10 ENCOUNTER — Encounter: Payer: Self-pay | Admitting: Family Medicine

## 2020-07-10 ENCOUNTER — Ambulatory Visit (INDEPENDENT_AMBULATORY_CARE_PROVIDER_SITE_OTHER): Payer: Medicaid Other | Admitting: Family Medicine

## 2020-07-10 ENCOUNTER — Other Ambulatory Visit (HOSPITAL_BASED_OUTPATIENT_CLINIC_OR_DEPARTMENT_OTHER): Payer: Self-pay

## 2020-07-10 ENCOUNTER — Other Ambulatory Visit: Payer: Self-pay

## 2020-07-10 VITALS — BP 123/56 | HR 84 | Wt 247.0 lb

## 2020-07-10 DIAGNOSIS — Z30011 Encounter for initial prescription of contraceptive pills: Secondary | ICD-10-CM

## 2020-07-10 LAB — POCT URINE PREGNANCY: Preg Test, Ur: NEGATIVE

## 2020-07-10 MED ORDER — NORETHIN ACE-ETH ESTRAD-FE 1-20 MG-MCG(24) PO TABS
1.0000 | ORAL_TABLET | Freq: Every day | ORAL | 11 refills | Status: AC
  Filled 2020-07-10: qty 28, 28d supply, fill #0

## 2020-07-10 NOTE — Addendum Note (Signed)
Addended by: Mikey Bussing on: 07/10/2020 09:34 AM   Modules accepted: Orders

## 2020-07-10 NOTE — Progress Notes (Signed)
   Subjective:    Patient ID: Kara Sanchez, female    DOB: 12-31-1990, 30 y.o.   MRN: [id number]  HPI Patient seen - would like to transition birth control to OCPs. Having increased depression with the depo provera.   Review of Systems     Objective:   Physical Exam Vitals and nursing note reviewed.  Cardiovascular:     Rate and Rhythm: Normal rate and regular rhythm.     Pulses: Normal pulses.     Heart sounds: Normal heart sounds.  Pulmonary:     Effort: Pulmonary effort is normal.     Breath sounds: Normal breath sounds.       Assessment & Plan:  1. Encounter for BCP (birth control pills) initial prescription No contraindications. Start loestrin.

## 2020-07-11 ENCOUNTER — Ambulatory Visit: Payer: Medicaid Other | Admitting: Family Medicine

## 2020-07-29 ENCOUNTER — Other Ambulatory Visit (HOSPITAL_BASED_OUTPATIENT_CLINIC_OR_DEPARTMENT_OTHER): Payer: Self-pay

## 2020-07-30 ENCOUNTER — Other Ambulatory Visit (HOSPITAL_BASED_OUTPATIENT_CLINIC_OR_DEPARTMENT_OTHER): Payer: Self-pay

## 2020-08-09 ENCOUNTER — Ambulatory Visit: Payer: Medicaid Other

## 2021-01-06 ENCOUNTER — Other Ambulatory Visit: Payer: Self-pay

## 2021-01-06 MED ORDER — VALACYCLOVIR HCL 1 G PO TABS: 1000.0000 mg | ORAL_TABLET | Freq: Two times a day (BID) | ORAL | 2 refills | Status: DC

## 2021-01-10 ENCOUNTER — Other Ambulatory Visit: Payer: Self-pay

## 2021-01-10 MED ORDER — VALACYCLOVIR HCL 1 G PO TABS: 1000.0000 mg | ORAL_TABLET | Freq: Two times a day (BID) | ORAL | 2 refills | Status: AC

## 2021-01-10 NOTE — Progress Notes (Signed)
Request refill of valtrex for outbreak. Armandina Stammer RN
